# Patient Record
Sex: Male | Born: 1965
Health system: Southern US, Community
[De-identification: ages and names within clinical notes are randomized; demographics above are authoritative.]

## PROBLEM LIST (undated history)

## (undated) DIAGNOSIS — F419 Anxiety disorder, unspecified: Secondary | ICD-10-CM

## (undated) DIAGNOSIS — F4310 Post-traumatic stress disorder, unspecified: Secondary | ICD-10-CM

## (undated) DIAGNOSIS — I10 Essential (primary) hypertension: Secondary | ICD-10-CM

## (undated) DIAGNOSIS — F319 Bipolar disorder, unspecified: Secondary | ICD-10-CM

---

## 2005-04-06 ENCOUNTER — Emergency Department: Payer: Self-pay | Admitting: Emergency Medicine

## 2005-10-24 ENCOUNTER — Emergency Department: Payer: Self-pay | Admitting: Emergency Medicine

## 2005-10-24 ENCOUNTER — Other Ambulatory Visit: Payer: Self-pay

## 2005-11-29 ENCOUNTER — Emergency Department: Payer: Self-pay | Admitting: Emergency Medicine

## 2006-11-02 ENCOUNTER — Emergency Department: Payer: Self-pay | Admitting: Emergency Medicine

## 2008-06-24 ENCOUNTER — Emergency Department: Payer: Self-pay | Admitting: Emergency Medicine

## 2008-06-25 ENCOUNTER — Emergency Department: Payer: Self-pay | Admitting: Emergency Medicine

## 2009-05-22 ENCOUNTER — Emergency Department: Payer: Self-pay | Admitting: Internal Medicine

## 2009-08-26 ENCOUNTER — Emergency Department: Payer: Self-pay | Admitting: Emergency Medicine

## 2014-08-16 ENCOUNTER — Emergency Department: Payer: Self-pay | Admitting: Internal Medicine

## 2014-08-20 ENCOUNTER — Emergency Department: Payer: Self-pay | Admitting: Emergency Medicine

## 2015-01-06 ENCOUNTER — Telehealth: Payer: Self-pay | Admitting: Family Medicine

## 2015-01-06 DIAGNOSIS — I10 Essential (primary) hypertension: Secondary | ICD-10-CM

## 2015-01-06 MED ORDER — LISINOPRIL-HYDROCHLOROTHIAZIDE 10-12.5 MG PO TABS: 1.0000 | ORAL_TABLET | Freq: Every day | ORAL | Status: DC

## 2015-01-06 NOTE — Telephone Encounter (Signed)
Refill sent.

## 2015-01-06 NOTE — Telephone Encounter (Signed)
Pt is requesting refill on lisinopril. Please send to [email or web address]

## 2015-03-06 ENCOUNTER — Encounter: Payer: Self-pay | Admitting: Emergency Medicine

## 2015-03-06 ENCOUNTER — Emergency Department
Admission: EM | Admit: 2015-03-06 | Discharge: 2015-03-06 | Disposition: A | Discharge status: Home / Self Care | Payer: Self-pay | Attending: Emergency Medicine | Admitting: Emergency Medicine

## 2015-03-06 DIAGNOSIS — Z79899 Other long term (current) drug therapy: Secondary | ICD-10-CM | POA: Insufficient documentation

## 2015-03-06 DIAGNOSIS — K2970 Gastritis, unspecified, without bleeding: Secondary | ICD-10-CM | POA: Insufficient documentation

## 2015-03-06 DIAGNOSIS — I10 Essential (primary) hypertension: Secondary | ICD-10-CM | POA: Insufficient documentation

## 2015-03-06 HISTORY — DX: Essential (primary) hypertension: I10

## 2015-03-06 HISTORY — DX: Bipolar disorder, unspecified: F31.9

## 2015-03-06 HISTORY — DX: Anxiety disorder, unspecified: F41.9

## 2015-03-06 HISTORY — DX: Post-traumatic stress disorder, unspecified: F43.10

## 2015-03-06 LAB — URINALYSIS COMPLETE WITH MICROSCOPIC (ARMC ONLY)
BILIRUBIN URINE: NEGATIVE
Bacteria, UA: NONE SEEN
GLUCOSE, UA: NEGATIVE mg/dL
Hgb urine dipstick: NEGATIVE
KETONES UR: NEGATIVE mg/dL
Leukocytes, UA: NEGATIVE
Nitrite: NEGATIVE
Protein, ur: NEGATIVE mg/dL
SQUAMOUS EPITHELIAL / LPF: NONE SEEN
Specific Gravity, Urine: 1.017 (ref 1.005–1.030)
pH: 6 (ref 5.0–8.0)

## 2015-03-06 MED ORDER — ONDANSETRON 8 MG PO TBDP
ORAL_TABLET | ORAL | Status: AC
  Filled 2015-03-06: qty 1

## 2015-03-06 MED ORDER — RANITIDINE HCL 150 MG PO CAPS: 150.0000 mg | ORAL_CAPSULE | Freq: Two times a day (BID) | ORAL | Status: DC

## 2015-03-06 MED ORDER — ONDANSETRON 8 MG PO TBDP
8.0000 mg | ORAL_TABLET | Freq: Once | ORAL | Status: AC
  Administered 2015-03-06: 8 mg via ORAL

## 2015-03-06 MED ORDER — SUCRALFATE 1 G PO TABS: 1.0000 g | ORAL_TABLET | Freq: Four times a day (QID) | ORAL | Status: DC

## 2015-03-06 NOTE — ED Provider Notes (Signed)
[facility] Emergency Department Provider Note  ____________________________________________  Time seen: 5:00 AM  I have reviewed the triage vital signs and the nursing notes.   HISTORY  Chief Complaint Emesis    HPI Jose May is a 49 y.o. male who complains of upper abdominal pain and nausea and vomiting after eating at [employer]. He does report a history of acid reflux. He also has gallstones but has not had any surgeries in the past. No fevers or chills chest pain shortness of breath dizziness or syncope. No diarrhea. He was given Zofran on arrival in the ED, and is now feeling much better.When he had pain, was in the epigastric and left upper abdomen, sharp, nonradiating, no aggravating or alleviating factors.     Past Medical History  Diagnosis Date  . Hypertension   . PTSD (post-traumatic stress disorder)   . Anxiety   . Bipolar 1 disorder     There are no active problems to display for this patient.   History reviewed. No pertinent past surgical history.  Current Outpatient Rx  Name  Route  Sig  Dispense  Refill  . QUEtiapine (SEROQUEL) 100 MG tablet   Oral   Take 100 mg by mouth at bedtime.         . traZODone (DESYREL) 100 MG tablet   Oral   Take 100 mg by mouth at bedtime.         Marland Kitchen lisinopril-hydrochlorothiazide (PRINZIDE,ZESTORETIC) 10-12.5 MG per tablet   Oral   Take 1 tablet by mouth daily.   90 tablet   1   . ranitidine (ZANTAC) 150 MG capsule   Oral   Take 1 capsule (150 mg total) by mouth 2 (two) times daily.   28 capsule   0   . sucralfate (CARAFATE) 1 G tablet   Oral   Take 1 tablet (1 g total) by mouth 4 (four) times daily.   120 tablet   1     Allergies Review of patient's allergies indicates no known allergies.  History reviewed. No pertinent family history.  Social History History  Substance Use Topics  . Smoking status: Never Smoker   . Smokeless tobacco: Not on file  . Alcohol  Use: No    Review of Systems  Constitutional: No fever or chills. No weight changes Eyes:No blurry vision or double vision.  ENT: No sore throat. Cardiovascular: No chest pain. Respiratory: No dyspnea or cough. Gastrointestinal: Upper abdominal pain with vomiting.  No BRBPR or melena. Genitourinary: Negative for dysuria, urinary retention, bloody urine, or difficulty urinating. Musculoskeletal: Negative for back pain. No joint swelling or pain. Skin: Negative for rash. Neurological: Negative for headaches, focal weakness or numbness. Psychiatric:No anxiety or depression.   Endocrine:No hot/cold intolerance, changes in energy, or sleep difficulty.  10-point ROS otherwise negative.  ____________________________________________   PHYSICAL EXAM:  VITAL SIGNS: ED Triage Vitals  Enc Vitals Group     BP 03/06/15 0252 128/88 mmHg     Pulse Rate 03/06/15 0252 106     Resp 03/06/15 0252 18     Temp 03/06/15 0252 98.6 F (37 C)     Temp Source 03/06/15 0252 Oral     SpO2 03/06/15 0252 97 %     Weight 03/06/15 0252 190 lb (86.183 kg)     Height 03/06/15 0252 5\' 9"  (1.753 m)     Head Cir --      Peak Flow --      Pain Score 03/06/15  0256 6     Pain Loc --      Pain Edu? --      Excl. in GC? --      Constitutional: Alert and oriented. Well appearing and in no distress. Calm and comfortable Eyes: No scleral icterus. No conjunctival pallor. PERRL. EOMI ENT   Head: Normocephalic and atraumatic.   Nose: No congestion/rhinnorhea. No septal hematoma   Mouth/Throat: MMM, no pharyngeal erythema. No peritonsillar mass. No uvula shift.   Neck: No stridor. No SubQ emphysema. No meningismus. Hematological/Lymphatic/Immunilogical: No cervical lymphadenopathy. Cardiovascular: RRR. Normal and symmetric distal pulses are present in all extremities. No murmurs, rubs, or gallops. Respiratory: Normal respiratory effort without tachypnea nor retractions. Breath sounds are clear and  equal bilaterally. No wheezes/rales/rhonchi. Gastrointestinal: Mild left upper quadrant tenderness. No distention. There is no CVA tenderness.  No rebound, rigidity, or guarding. Genitourinary: deferred Musculoskeletal: Nontender with normal range of motion in all extremities. No joint effusions.  No lower extremity tenderness.  No edema. Neurologic:   Normal speech and language.  CN 2-10 normal. Motor grossly intact. No pronator drift.  Normal gait. No gross focal neurologic deficits are appreciated.  Skin:  Skin is warm, dry and intact. No rash noted.  No petechiae, purpura, or bullae. Psychiatric: Mood and affect are normal. Speech and behavior are normal. Patient exhibits appropriate insight and judgment.  ____________________________________________    LABS (pertinent positives/negatives) (all labs ordered are listed, but only abnormal results are displayed) Labs Reviewed  URINALYSIS COMPLETEWITH MICROSCOPIC ([facility] ONLY) - Abnormal; Notable for the following:    Color, Urine YELLOW (*)    APPearance CLEAR (*)    All other components within normal limits   ____________________________________________   EKG    ____________________________________________    RADIOLOGY    ____________________________________________   PROCEDURES  ____________________________________________   INITIAL IMPRESSION / ASSESSMENT AND PLAN / ED COURSE  Pertinent labs & imaging results that were available during my care of the patient were reviewed by me and considered in my medical decision making (see chart for details).  Patient presents with gastritis after eating fast food. He is feeling much better after receiving Zofran. Low suspicion for pancreatitis cholecystitis AAA or surgical abdomen. No evidence of cardiopulmonary pathology or sepsis. We'll give him antacids and keep him on H2 blocker for a week.  ____________________________________________   FINAL CLINICAL IMPRESSION(S)  / ED DIAGNOSES  Final diagnoses:  Gastritis      Sharman Cheek, MD 03/06/15 [phone]

## 2015-03-06 NOTE — ED Notes (Signed)
Pt presents to ER alert and in NAD. Pt states he has vomited several times tonight. Pt denies diarrhea.

## 2015-03-06 NOTE — Discharge Instructions (Signed)
Gastritis, Adult °Gastritis is soreness and puffiness (inflammation) of the lining of the stomach. If you do not get help, gastritis can cause bleeding and sores (ulcers) in the stomach. °HOME CARE  °· Only take medicine as told by your doctor. °· If you were given antibiotic medicines, take them as told. Finish the medicines even if you start to feel better. °· Drink enough fluids to keep your pee (urine) clear or pale yellow. °· Avoid foods and drinks that make your problems worse. Foods you may want to avoid include: °¨ Caffeine or alcohol. °¨ Chocolate. °¨ Mint. °¨ Garlic and onions. °¨ Spicy foods. °¨ Citrus fruits, including oranges, lemons, or limes. °¨ Food containing tomatoes, including sauce, chili, salsa, and pizza. °¨ Fried and fatty foods. °· Eat small meals throughout the day instead of large meals. °GET HELP RIGHT AWAY IF:  °· You have black or dark red poop (stools). °· You throw up (vomit) blood. It may look like coffee grounds. °· You cannot keep fluids down. °· Your belly (abdominal) pain gets worse. °· You have a fever. °· You do not feel better after 1 week. °· You have any other questions or concerns. °MAKE SURE YOU:  °· Understand these instructions. °· Will watch your condition. °· Will get help right away if you are not doing well or get worse. °Document Released: 01/10/2008 Document Revised: 10/16/2011 Document Reviewed: 09/06/2011 °Exit[employer]® Patient Information ©2015 [employer]. This information is not intended to replace advice given to you by your health care provider. Make sure you discuss any questions you have with your health care provider. ° °

## 2016-03-11 ENCOUNTER — Encounter: Payer: Self-pay | Admitting: *Deleted

## 2016-03-11 ENCOUNTER — Emergency Department
Admission: EM | Admit: 2016-03-11 | Discharge: 2016-03-11 | Disposition: A | Discharge status: Home / Self Care | Payer: Self-pay | Attending: Emergency Medicine | Admitting: Emergency Medicine

## 2016-03-11 DIAGNOSIS — Z79899 Other long term (current) drug therapy: Secondary | ICD-10-CM | POA: Insufficient documentation

## 2016-03-11 DIAGNOSIS — I10 Essential (primary) hypertension: Secondary | ICD-10-CM | POA: Insufficient documentation

## 2016-03-11 DIAGNOSIS — Z760 Encounter for issue of repeat prescription: Secondary | ICD-10-CM | POA: Insufficient documentation

## 2016-03-11 MED ORDER — QUETIAPINE FUMARATE 100 MG PO TABS: 100.0000 mg | ORAL_TABLET | Freq: Every day | ORAL | 0 refills | Status: DC

## 2016-03-11 MED ORDER — QUETIAPINE FUMARATE 25 MG PO TABS
100.0000 mg | ORAL_TABLET | Freq: Once | ORAL | Status: AC
  Administered 2016-03-11: 100 mg via ORAL
  Filled 2016-03-11: qty 4

## 2016-03-11 NOTE — ED Provider Notes (Signed)
[facility] Emergency Department Provider Note  ____________________________________________  Time seen: Approximately 8:38 PM  I have reviewed the triage vital signs and the nursing notes.   HISTORY  Chief Complaint Anxiety and Medication Refill   HPI Jose May is a 50 y.o. male who presents to the emergency department for a dose of Seroquel. His significant other states that they tried to get it refilled on Friday, but the prescription was not called in. He has not missed any doses, but he does not have any for the weekend.  Past Medical History:  Diagnosis Date  . Anxiety   . Bipolar 1 disorder (HCC)   . Hypertension   . PTSD (post-traumatic stress disorder)     There are no active problems to display for this patient.   History reviewed. No pertinent surgical history.  Prior to Admission medications   Medication Sig Start Date End Date Taking? Authorizing Provider  lisinopril-hydrochlorothiazide (PRINZIDE,ZESTORETIC) 10-12.5 MG per tablet Take 1 tablet by mouth daily. 01/06/15   Edwena Felty, MD  QUEtiapine (SEROQUEL) 100 MG tablet Take 1 tablet (100 mg total) by mouth at bedtime. 03/11/16 03/14/16  Chinita Pester, FNP  ranitidine (ZANTAC) 150 MG capsule Take 1 capsule (150 mg total) by mouth 2 (two) times daily. 03/06/15   Sharman Cheek, MD  sucralfate (CARAFATE) 1 G tablet Take 1 tablet (1 g total) by mouth 4 (four) times daily. 03/06/15   Sharman Cheek, MD  traZODone (DESYREL) 100 MG tablet Take 100 mg by mouth at bedtime.    Historical Provider, MD    Allergies Review of patient's allergies indicates no known allergies.  History reviewed. No pertinent family history.  Social History Social History  Substance Use Topics  . Smoking status: Never Smoker  . Smokeless tobacco: Never Used  . Alcohol use No    Review of Systems Constitutional: Negative for fever. ENT: Negative for sore throat. Respiratory: No cough or  congestion Gastrointestinal: No abdominal pain.  No nausea, no vomiting.  No diarrhea.  Musculoskeletal: Negative for generalized body aches. Skin: Negative for rash/lesion/wound. Neurological: Negative for headaches, focal weakness or numbness.  ____________________________________________   PHYSICAL EXAM:  VITAL SIGNS: ED Triage Vitals [03/11/16 1933]  Enc Vitals Group     BP 120/83     Pulse Rate 88     Resp 16     Temp 98.1 F (36.7 C)     Temp Source Oral     SpO2 95 %     Weight 188 lb (85.3 kg)     Height 5' 8.5" (1.74 m)     Head Circumference      Peak Flow      Pain Score      Pain Loc      Pain Edu?      Excl. in GC?     Constitutional: Alert and oriented. Well appearing and in no acute distress. Eyes: Conjunctivae are normal. PERRL. EOMI. Head: Atraumatic. Nose: No congestion/rhinnorhea. Mouth/Throat: Mucous membranes are moist. Neck: No stridor.  Cardiovascular: Normal rate, regular rhythm. Good peripheral circulation. Respiratory: Normal respiratory effort. Musculoskeletal: Full ROM throughout.  Neurologic:  Normal speech and language. No gross focal neurologic deficits are appreciated. Speech is normal. No gait instability. Skin:  Skin is warm, dry and intact. No rash noted. Psychiatric: Mood and affect are normal. Speech and behavior are normal.  ____________________________________________   LABS (all labs ordered are listed, but only abnormal results are displayed)  Labs Reviewed -  No data to display ____________________________________________  EKG   ____________________________________________  RADIOLOGY   ____________________________________________   PROCEDURES   ____________________________________________   INITIAL IMPRESSION / ASSESSMENT AND PLAN / ED COURSE  Pertinent labs & imaging results that were available during my care of the patient were reviewed by me and considered in my medical decision making (see chart for  details).  Patient was given his nightly dose of Seroquel while here. Last month's prescription bottle was presented and the dosage was confirmed. He will be given a prescription for 3 days supply.  Patient was advised to call [facility] Monday morning to follow up on the prescription for the month. ____________________________________________   FINAL CLINICAL IMPRESSION(S) / ED DIAGNOSES  Final diagnoses:  Medication refill       Chinita Pester, FNP 03/11/16 4098    Jennye Moccasin, MD 03/11/16 2147

## 2016-03-11 NOTE — ED Notes (Signed)
Pt to ED for med refill for seroquel. Pt states he tried to get it refilled Friday but MD left office. Pt denies any pain , some anxiety today. Last dose was last night . Pt in NAD at thsi time

## 2016-03-11 NOTE — ED Triage Notes (Signed)
Pt arrived to ED reporting he ran out of Quetiapine. Pt took medication last night and reports that he usually takes 100mg  at night but is unable to take tonight and feels as though he is having a panic attack. Pt reports he feels as though his heart is racing and he is SOB. Pt is calm in triage and shows no sign of distress. Pt reports he called PCP for refill but PCP did not refill before the end of the day.

## 2017-03-25 ENCOUNTER — Emergency Department
Admission: EM | Admit: 2017-03-25 | Discharge: 2017-03-25 | Disposition: A | Discharge status: Home / Self Care | Payer: Medicaid Other | Attending: Emergency Medicine | Admitting: Emergency Medicine

## 2017-03-25 ENCOUNTER — Encounter: Payer: Self-pay | Admitting: Emergency Medicine

## 2017-03-25 ENCOUNTER — Emergency Department: Payer: Medicaid Other

## 2017-03-25 DIAGNOSIS — F319 Bipolar disorder, unspecified: Secondary | ICD-10-CM | POA: Diagnosis not present

## 2017-03-25 DIAGNOSIS — F419 Anxiety disorder, unspecified: Secondary | ICD-10-CM | POA: Diagnosis not present

## 2017-03-25 DIAGNOSIS — R1031 Right lower quadrant pain: Secondary | ICD-10-CM | POA: Diagnosis not present

## 2017-03-25 DIAGNOSIS — I10 Essential (primary) hypertension: Secondary | ICD-10-CM | POA: Insufficient documentation

## 2017-03-25 DIAGNOSIS — R1011 Right upper quadrant pain: Secondary | ICD-10-CM | POA: Diagnosis present

## 2017-03-25 DIAGNOSIS — N200 Calculus of kidney: Secondary | ICD-10-CM | POA: Diagnosis not present

## 2017-03-25 LAB — COMPREHENSIVE METABOLIC PANEL
ALT: 16 U/L — ABNORMAL LOW (ref 17–63)
AST: 21 U/L (ref 15–41)
Albumin: 4.2 g/dL (ref 3.5–5.0)
Alkaline Phosphatase: 79 U/L (ref 38–126)
Anion gap: 9 (ref 5–15)
BUN: 17 mg/dL (ref 6–20)
CHLORIDE: 102 mmol/L (ref 101–111)
CO2: 27 mmol/L (ref 22–32)
CREATININE: 1.52 mg/dL — AB (ref 0.61–1.24)
Calcium: 9 mg/dL (ref 8.9–10.3)
GFR calc Af Amer: 59 mL/min — ABNORMAL LOW (ref >60)
GFR, EST NON AFRICAN AMERICAN: 51 mL/min — AB (ref >60)
GLUCOSE: 154 mg/dL — AB (ref 65–99)
Potassium: 3.9 mmol/L (ref 3.5–5.1)
Sodium: 138 mmol/L (ref 135–145)
Total Bilirubin: 0.8 mg/dL (ref 0.3–1.2)
Total Protein: 8.1 g/dL (ref 6.5–8.1)

## 2017-03-25 LAB — URINALYSIS, COMPLETE (UACMP) WITH MICROSCOPIC
Bacteria, UA: NONE SEEN
Bilirubin Urine: NEGATIVE
Glucose, UA: NEGATIVE mg/dL
Ketones, ur: 20 mg/dL — AB
Leukocytes, UA: NEGATIVE
Nitrite: NEGATIVE
PH: 5 (ref 5.0–8.0)
Protein, ur: 30 mg/dL — AB
SPECIFIC GRAVITY, URINE: 1.029 (ref 1.005–1.030)
SQUAMOUS EPITHELIAL / LPF: NONE SEEN

## 2017-03-25 LAB — CBC
HCT: 42.5 % (ref 40.0–52.0)
Hemoglobin: 14.1 g/dL (ref 13.0–18.0)
MCH: 26.7 pg (ref 26.0–34.0)
MCHC: 33.1 g/dL (ref 32.0–36.0)
MCV: 80.7 fL (ref 80.0–100.0)
PLATELETS: 323 10*3/uL (ref 150–440)
RBC: 5.27 MIL/uL (ref 4.40–5.90)
RDW: 14.9 % — AB (ref 11.5–14.5)
WBC: 13.3 10*3/uL — AB (ref 3.8–10.6)

## 2017-03-25 LAB — LIPASE, BLOOD: LIPASE: 20 U/L (ref 11–51)

## 2017-03-25 MED ORDER — ONDANSETRON HCL 4 MG PO TABS: 4.0000 mg | ORAL_TABLET | Freq: Three times a day (TID) | ORAL | 0 refills | Status: DC | PRN

## 2017-03-25 MED ORDER — OXYCODONE-ACETAMINOPHEN 5-325 MG PO TABS: 1.0000 | ORAL_TABLET | Freq: Four times a day (QID) | ORAL | 0 refills | Status: DC | PRN

## 2017-03-25 MED ORDER — TAMSULOSIN HCL 0.4 MG PO CAPS: 0.4000 mg | ORAL_CAPSULE | Freq: Every day | ORAL | 0 refills | Status: AC

## 2017-03-25 MED ORDER — SODIUM CHLORIDE 0.9 % IV BOLUS (SEPSIS)
1000.0000 mL | Freq: Once | INTRAVENOUS | Status: AC
  Administered 2017-03-25: 1000 mL via INTRAVENOUS

## 2017-03-25 MED ORDER — IBUPROFEN 600 MG PO TABS: 600.0000 mg | ORAL_TABLET | Freq: Four times a day (QID) | ORAL | 0 refills | Status: DC | PRN

## 2017-03-25 MED ORDER — ONDANSETRON HCL 4 MG/2ML IJ SOLN
4.0000 mg | Freq: Once | INTRAMUSCULAR | Status: AC
  Administered 2017-03-25: 4 mg via INTRAVENOUS
  Filled 2017-03-25: qty 2

## 2017-03-25 MED ORDER — MORPHINE SULFATE (PF) 4 MG/ML IV SOLN
4.0000 mg | Freq: Once | INTRAVENOUS | Status: AC
  Administered 2017-03-25: 4 mg via INTRAVENOUS
  Filled 2017-03-25: qty 1

## 2017-03-25 MED ORDER — TAMSULOSIN HCL 0.4 MG PO CAPS
0.4000 mg | ORAL_CAPSULE | Freq: Once | ORAL | Status: AC
  Administered 2017-03-25: 0.4 mg via ORAL
  Filled 2017-03-25: qty 1

## 2017-03-25 MED ORDER — IBUPROFEN 600 MG PO TABS
600.0000 mg | ORAL_TABLET | Freq: Once | ORAL | Status: AC
  Administered 2017-03-25: 600 mg via ORAL
  Filled 2017-03-25: qty 1

## 2017-03-25 NOTE — ED Notes (Signed)
Immediately after starting fluids IV was noted to be infiltrated; IV removed and new IV started to left AC: pt tolerated well

## 2017-03-25 NOTE — ED Provider Notes (Signed)
[facility] Emergency Department Provider Note  ____________________________________________  Time seen: Approximately 5:23 AM  I have reviewed the triage vital signs and the nursing notes.   HISTORY  Chief Complaint Flank Pain and Abdominal Pain   HPI Jose May is a 51 y.o. male with a history of bipolar, hypertension, anxiety who presents for evaluation of right flank pain. Patient reports sudden onset of severe right flank pain radiating to his right lower abdomen, constant, sharp, 10 out of 10, associated with nausea and several episodes of nonbloody nonbilious emesis. Patient thinks he might have had a kidney stone in the past but he is not sure. No dysuria or hematuria. No fever or chills. No prior abdominal surgeries. No diarrhea or constipation.  Past Medical History:  Diagnosis Date  . Anxiety   . Bipolar 1 disorder (HCC)   . Hypertension   . PTSD (post-traumatic stress disorder)     There are no active problems to display for this patient.   History reviewed. No pertinent surgical history.  Prior to Admission medications   Medication Sig Start Date End Date Taking? Authorizing Provider  QUEtiapine (SEROQUEL) 100 MG tablet Take 1 tablet (100 mg total) by mouth at bedtime. 03/11/16 03/25/17 Yes Triplett, Cari B, FNP  traZODone (DESYREL) 100 MG tablet Take 100 mg by mouth at bedtime.   Yes [provider]  ibuprofen (ADVIL,MOTRIN) 600 MG tablet Take 1 tablet (600 mg total) by mouth every 6 (six) hours as needed. 03/25/17   Nita Sickle, MD  ondansetron (ZOFRAN) 4 MG tablet Take 1 tablet (4 mg total) by mouth every 8 (eight) hours as needed for nausea or vomiting. 03/25/17   Don Perking, [place], MD  oxyCODONE-acetaminophen (ROXICET) 5-325 MG tablet Take 1 tablet by mouth every 6 (six) hours as needed. 03/25/17 03/25/18  Nita Sickle, MD  tamsulosin (FLOMAX) 0.4 MG CAPS capsule Take 1 capsule (0.4 mg total) by mouth  daily. 03/25/17 04/01/17  Nita Sickle, MD    Allergies Patient has no known allergies.  History reviewed. No pertinent family history.  Social History Social History  Substance Use Topics  . Smoking status: Never Smoker  . Smokeless tobacco: Never Used  . Alcohol use No    Review of Systems  Constitutional: Negative for fever. Eyes: Negative for visual changes. ENT: Negative for sore throat. Neck: No neck pain  Cardiovascular: Negative for chest pain. Respiratory: Negative for shortness of breath. Gastrointestinal: Negative for abdominal pain, vomiting or diarrhea. Genitourinary: Negative for dysuria. + R flank pain Musculoskeletal: Negative for back pain. Skin: Negative for rash. Neurological: Negative for headaches, weakness or numbness. Psych: No SI or HI  ____________________________________________   PHYSICAL EXAM:  VITAL SIGNS: ED Triage Vitals  Enc Vitals Group     BP 03/25/17 0116 (!) 143/96     Pulse Rate 03/25/17 0116 82     Resp 03/25/17 0116 (!) 22     Temp 03/25/17 0116 98.9 F (37.2 C)     Temp Source 03/25/17 0116 Oral     SpO2 03/25/17 0116 96 %     Weight 03/25/17 0114 188 lb (85.3 kg)     Height 03/25/17 0114 5\' 9"  (1.753 m)     Head Circumference --      Peak Flow --      Pain Score 03/25/17 0432 10     Pain Loc --      Pain Edu? --      Excl. in GC? --  Constitutional: Alert and oriented. Well appearing and in no apparent distress. HEENT:      Head: Normocephalic and atraumatic.         Eyes: Conjunctivae are normal. Sclera is non-icteric.       Mouth/Throat: Mucous membranes are moist.       Neck: Supple with no signs of meningismus. Cardiovascular: Regular rate and rhythm. No murmurs, gallops, or rubs. 2+ symmetrical distal pulses are present in all extremities. No JVD. Respiratory: Normal respiratory effort. Lungs are clear to auscultation bilaterally. No wheezes, crackles, or rhonchi.  Gastrointestinal: Soft, non tender,  and non distended with positive bowel sounds. No rebound or guarding. Genitourinary: R CVA tenderness. Musculoskeletal: Nontender with normal range of motion in all extremities. No edema, cyanosis, or erythema of extremities. Neurologic: Normal speech and language. Face is symmetric. Moving all extremities. No gross focal neurologic deficits are appreciated. Skin: Skin is warm, dry and intact. No rash noted. Psychiatric: Mood and affect are normal. Speech and behavior are normal.  ____________________________________________   LABS (all labs ordered are listed, but only abnormal results are displayed)  Labs Reviewed  COMPREHENSIVE METABOLIC PANEL - Abnormal; Notable for the following:       Result Value   Glucose, Bld 154 (*)    Creatinine, Ser 1.52 (*)    ALT 16 (*)    GFR calc non Af Amer 51 (*)    GFR calc Af Amer 59 (*)    All other components within normal limits  CBC - Abnormal; Notable for the following:    WBC 13.3 (*)    RDW 14.9 (*)    All other components within normal limits  URINALYSIS, COMPLETE (UACMP) WITH MICROSCOPIC - Abnormal; Notable for the following:    Color, Urine YELLOW (*)    APPearance CLEAR (*)    Hgb urine dipstick MODERATE (*)    Ketones, ur 20 (*)    Protein, ur 30 (*)    All other components within normal limits  LIPASE, BLOOD   ____________________________________________  EKG  none  ____________________________________________  RADIOLOGY  CT renal:   ____________________________________________   PROCEDURES  Procedure(s) performed: None Procedures Critical Care performed:  None ____________________________________________   INITIAL IMPRESSION / ASSESSMENT AND PLAN / ED COURSE   51 y.o. male with a history of bipolar, hypertension, anxiety who presents for evaluation of sudden onset of right flank pain radiating to the right lower quadrant and associated with nausea and vomiting. UA showing blood but no evidence of infection.  Creatinine of 1.52 with normal GFR for an African American. CT showing a 4 mm distal right ureteral stone. Patient will be given ibuprofen, morphine, Flomax, Zofran and reassess.  ______________ 7:12 AM on 03/25/2017 -----------------------------------------  Pain is well controlled. Patient is tolerating by mouth. Patients can be discharged home on ibuprofen, Flomax, Zofran, Percocet, a referral to urology. Recommended return to the emergency room for pain not well controlled at home, fever, nausea or vomiting, worsening abdominal pain, or dysuria.  Pertinent labs & imaging results that were available during my care of the patient were reviewed by me and considered in my medical decision making (see chart for details).    ____________________________________________   FINAL CLINICAL IMPRESSION(S) / ED DIAGNOSES  Final diagnoses:  Kidney stone      NEW MEDICATIONS STARTED DURING THIS VISIT:  New Prescriptions   IBUPROFEN (ADVIL,MOTRIN) 600 MG TABLET    Take 1 tablet (600 mg total) by mouth every 6 (six) hours as needed.  ONDANSETRON (ZOFRAN) 4 MG TABLET    Take 1 tablet (4 mg total) by mouth every 8 (eight) hours as needed for nausea or vomiting.   OXYCODONE-ACETAMINOPHEN (ROXICET) 5-325 MG TABLET    Take 1 tablet by mouth every 6 (six) hours as needed.   TAMSULOSIN (FLOMAX) 0.4 MG CAPS CAPSULE    Take 1 capsule (0.4 mg total) by mouth daily.     Note:  This document was prepared using Dragon voice recognition software and may include unintentional dictation errors.    Nita Sickle, MD 03/25/17 [phone]

## 2017-03-25 NOTE — ED Triage Notes (Signed)
Patient reports pain to right flank that radiates into right lower quad.  Denies urinary symptoms.

## 2017-03-25 NOTE — ED Notes (Signed)
Patient resting quietly laying on chairs in lobby.

## 2017-03-25 NOTE — ED Notes (Signed)
Pt given urinal by ED tech Lorin Picket, pt will use callbell when finished voiding so urine can be strained

## 2017-03-25 NOTE — Discharge Instructions (Signed)

## 2017-07-02 ENCOUNTER — Encounter (INDEPENDENT_AMBULATORY_CARE_PROVIDER_SITE_OTHER): Payer: Self-pay

## 2017-07-02 ENCOUNTER — Ambulatory Visit: Payer: Self-pay | Admitting: Pharmacy Technician

## 2017-07-02 DIAGNOSIS — Z79899 Other long term (current) drug therapy: Secondary | ICD-10-CM

## 2017-07-02 NOTE — Progress Notes (Signed)
  Completed [facility] application and contract.  Patient agreed to all terms of the [facility] contract.    Patient approved to receive medication assistance at [facility] through 2018, as long as eligibility continues to be met.    Provided patient with Civil engineer, contracting based on his particular needs.    Referred patient to [facility].  [place]

## 2017-07-17 ENCOUNTER — Ambulatory Visit: Payer: Self-pay

## 2017-08-14 ENCOUNTER — Ambulatory Visit: Payer: Self-pay | Admitting: Family Medicine

## 2017-08-14 VITALS — BP 142/92 | HR 85 | Temp 98.6°F | Wt 197.4 lb

## 2017-08-14 DIAGNOSIS — K219 Gastro-esophageal reflux disease without esophagitis: Secondary | ICD-10-CM

## 2017-08-14 DIAGNOSIS — N529 Male erectile dysfunction, unspecified: Secondary | ICD-10-CM

## 2017-08-14 DIAGNOSIS — N289 Disorder of kidney and ureter, unspecified: Secondary | ICD-10-CM

## 2017-08-14 DIAGNOSIS — I10 Essential (primary) hypertension: Secondary | ICD-10-CM

## 2017-08-14 MED ORDER — RANITIDINE HCL 150 MG PO TABS: 150.0000 mg | ORAL_TABLET | Freq: Two times a day (BID) | ORAL | 3 refills | Status: DC

## 2017-08-14 MED ORDER — LOSARTAN POTASSIUM 25 MG PO TABS: 25.0000 mg | ORAL_TABLET | Freq: Every day | ORAL | 3 refills | Status: DC

## 2017-08-14 NOTE — Progress Notes (Signed)
BP (!) 142/92 (BP Location: Left Arm, Patient Position: Sitting, Cuff Size: Normal)   Pulse 85   Temp 98.6 F (37 C)   Wt 197 lb 6.4 oz (89.5 kg)   BMI 29.15 kg/m    Subjective:    Patient ID: Jose May, male    DOB: 1965-10-18, 52 y.o.   MRN: [id number]  HPI: Gurshan Settlemire is a 52 y.o. male  Chief Complaint  Patient presents with  . Establish Care    HPI He has been without care for a while He was at [facility] but lost his insurance High blood pressure; checks BP at [facility] sometimes Was trying to donate plasma but his pressure would run high He used to take lisinopril; sometimes upset his stomach He has psychiatrist at [facility] He has acid reflux, used to take a pill; worse at night; knows triggers Has ED; reluctant to discuss; wife says this has been going on for a while Had a kidney stone; was in the hospital, labs reviewed  No flowsheet data found.  Relevant past medical, surgical, family and social history reviewed Past Medical History:  Diagnosis Date  . Anxiety   . Bipolar 1 disorder (HCC)   . Hypertension   . PTSD (post-traumatic stress disorder)    History reviewed. No pertinent surgical history. Family History  Problem Relation Age of Onset  . Diabetes Mother   . Hypertension Mother   . Hypertension Father    Social History   Tobacco Use  . Smoking status: Never Smoker  . Smokeless tobacco: Never Used  Substance Use Topics  . Alcohol use: No  . Drug use: No    Interim medical history since last visit reviewed. Allergies and medications reviewed  Review of Systems Per HPI unless specifically indicated above     Objective:    BP (!) 142/92 (BP Location: Left Arm, Patient Position: Sitting, Cuff Size: Normal)   Pulse 85   Temp 98.6 F (37 C)   Wt 197 lb 6.4 oz (89.5 kg)   BMI 29.15 kg/m   Wt Readings from Last 3 Encounters:  08/14/17 197 lb 6.4 oz (89.5 kg)  03/25/17 188 lb (85.3 kg)  03/11/16 188 lb (85.3  kg)    Physical Exam  Constitutional: He appears well-developed and well-nourished. No distress.  Eyes: No scleral icterus.  Cardiovascular: Normal rate and regular rhythm.  Pulmonary/Chest: Effort normal and breath sounds normal.  Musculoskeletal: He exhibits no edema.  Neurological: He is alert.  Skin: No pallor.  Psychiatric: He has a normal mood and affect.    Results for orders placed or performed during the hospital encounter of 03/25/17  Lipase, blood  Result Value Ref Range   Lipase 20 11 - 51 U/L  Comprehensive metabolic panel  Result Value Ref Range   Sodium 138 135 - 145 mmol/L   Potassium 3.9 3.5 - 5.1 mmol/L   Chloride 102 101 - 111 mmol/L   CO2 27 22 - 32 mmol/L   Glucose, Bld 154 (H) 65 - 99 mg/dL   BUN 17 6 - 20 mg/dL   Creatinine, Ser 4.09 (H) 0.61 - 1.24 mg/dL   Calcium 9.0 8.9 - 81.1 mg/dL   Total Protein 8.1 6.5 - 8.1 g/dL   Albumin 4.2 3.5 - 5.0 g/dL   AST 21 15 - 41 U/L   ALT 16 (L) 17 - 63 U/L   Alkaline Phosphatase 79 38 - 126 U/L   Total Bilirubin 0.8 0.3 - 1.2  mg/dL   GFR calc non Af Amer 51 (L) >60 mL/min   GFR calc Af Amer 59 (L) >60 mL/min   Anion gap 9 5 - 15  CBC  Result Value Ref Range   WBC 13.3 (H) 3.8 - 10.6 K/uL   RBC 5.27 4.40 - 5.90 MIL/uL   Hemoglobin 14.1 13.0 - 18.0 g/dL   HCT 62.942.5 52.840.0 - 41.352.0 %   MCV 80.7 80.0 - 100.0 fL   MCH 26.7 26.0 - 34.0 pg   MCHC 33.1 32.0 - 36.0 g/dL   RDW 24.414.9 (H) 01.011.5 - 27.214.5 %   Platelets 323 150 - 440 K/uL  Urinalysis, Complete w Microscopic  Result Value Ref Range   Color, Urine YELLOW (A) YELLOW   APPearance CLEAR (A) CLEAR   Specific Gravity, Urine 1.029 1.005 - 1.030   pH 5.0 5.0 - 8.0   Glucose, UA NEGATIVE NEGATIVE mg/dL   Hgb urine dipstick MODERATE (A) NEGATIVE   Bilirubin Urine NEGATIVE NEGATIVE   Ketones, ur 20 (A) NEGATIVE mg/dL   Protein, ur 30 (A) NEGATIVE mg/dL   Nitrite NEGATIVE NEGATIVE   Leukocytes, UA NEGATIVE NEGATIVE   RBC / HPF 6-30 0 - 5 RBC/hpf   WBC, UA 0-5 0 - 5  WBC/hpf   Bacteria, UA NONE SEEN NONE SEEN   Squamous Epithelial / LPF NONE SEEN NONE SEEN   Mucus PRESENT       Assessment & Plan:   Problem List Items Addressed This Visit    None    Visit Diagnoses    Essential hypertension, benign    -  Primary   Relevant Medications   losartan (COZAAR) 25 MG tablet   Other Relevant Orders   Basic Metabolic Panel (BMET)   Renal insufficiency       Relevant Orders   Basic Metabolic Panel (BMET)   Gastroesophageal reflux disease, esophagitis presence not specified       Relevant Medications   ranitidine (ZANTAC) 150 MG tablet   Other Relevant Orders   Basic Metabolic Panel (BMET)   Erectile dysfunction, unspecified erectile dysfunction type       Relevant Orders   Ambulatory referral to Urology   Basic Metabolic Panel (BMET)       Follow up plan: Return in about 2 weeks (around 08/28/2017).  An after-visit summary was printed and given to the patient at check-out.  Please see the patient instructions which may contain other information and recommendations beyond what is mentioned above in the assessment and plan.  Meds ordered this encounter  Medications  . ranitidine (ZANTAC) 150 MG tablet    Sig: Take 1 tablet (150 mg total) by mouth 2 (two) times daily.    Dispense:  60 tablet    Refill:  3  . losartan (COZAAR) 25 MG tablet    Sig: Take 1 tablet (25 mg total) by mouth daily.    Dispense:  30 tablet    Refill:  3    Orders Placed This Encounter  Procedures  . Basic Metabolic Panel (BMET)  . Ambulatory referral to Urology

## 2017-08-14 NOTE — Patient Instructions (Addendum)
Start losartan Return in 2 weeks for recheck and BMP here Try to follow the DASH guidelines (DASH stands for Dietary Approaches to Stop Hypertension). Try to limit the sodium in your diet to no more than 1,500mg  of sodium per day. Certainly try to not exceed 2,000 mg per day at the very most. Do not add salt when cooking or at the table.  Check the sodium amount on labels when shopping, and choose items lower in sodium when given a choice. Avoid or limit foods that already contain a lot of sodium. Eat a diet rich in fruits and vegetables and whole grains, and try to lose weight if overweight or obese   DASH Eating Plan DASH stands for "Dietary Approaches to Stop Hypertension." The DASH eating plan is a healthy eating plan that has been shown to reduce high blood pressure (hypertension). It may also reduce your risk for type 2 diabetes, heart disease, and stroke. The DASH eating plan may also help with weight loss. What are tips for following this plan? General guidelines  Avoid eating more than 2,300 mg (milligrams) of salt (sodium) a day. If you have hypertension, you may need to reduce your sodium intake to 1,500 mg a day.  Limit alcohol intake to no more than 1 drink a day for nonpregnant women and 2 drinks a day for men. One drink equals 12 oz of beer, 5 oz of wine, or 1 oz of hard liquor.  Work with your health care provider to maintain a healthy body weight or to lose weight. Ask what an ideal weight is for you.  Get at least 30 minutes of exercise that causes your heart to beat faster (aerobic exercise) most days of the week. Activities may include walking, swimming, or biking.  Work with your health care provider or diet and nutrition specialist (dietitian) to adjust your eating plan to your individual calorie needs. Reading food labels  Check food labels for the amount of sodium per serving. Choose foods with less than 5 percent of the Daily Value of sodium. Generally, foods with less  than 300 mg of sodium per serving fit into this eating plan.  To find whole grains, look for the word "whole" as the first word in the ingredient list. Shopping  Buy products labeled as "low-sodium" or "no salt added."  Buy fresh foods. Avoid canned foods and premade or frozen meals. Cooking  Avoid adding salt when cooking. Use salt-free seasonings or herbs instead of table salt or sea salt. Check with your health care provider or pharmacist before using salt substitutes.  Do not fry foods. Cook foods using healthy methods such as baking, boiling, grilling, and broiling instead.  Cook with heart-healthy oils, such as olive, canola, soybean, or sunflower oil. Meal planning   Eat a balanced diet that includes: ? 5 or more servings of fruits and vegetables each day. At each meal, try to fill half of your plate with fruits and vegetables. ? Up to 6-8 servings of whole grains each day. ? Less than 6 oz of lean meat, poultry, or fish each day. A 3-oz serving of meat is about the same size as a deck of cards. One egg equals 1 oz. ? 2 servings of low-fat dairy each day. ? A serving of nuts, seeds, or beans 5 times each week. ? Heart-healthy fats. Healthy fats called Omega-3 fatty acids are found in foods such as flaxseeds and coldwater fish, like sardines, salmon, and mackerel.  Limit how much you  eat of the following: ? Canned or prepackaged foods. ? Food that is high in trans fat, such as fried foods. ? Food that is high in saturated fat, such as fatty meat. ? Sweets, desserts, sugary drinks, and other foods with added sugar. ? Full-fat dairy products.  Do not salt foods before eating.  Try to eat at least 2 vegetarian meals each week.  Eat more home-cooked food and less restaurant, buffet, and fast food.  When eating at a restaurant, ask that your food be prepared with less salt or no salt, if possible. What foods are recommended? The items listed may not be a complete list. Talk  with your dietitian about what dietary choices are best for you. Grains Whole-grain or whole-wheat bread. Whole-grain or whole-wheat pasta. Brown rice. Orpah Cobb. Bulgur. Whole-grain and low-sodium cereals. Pita bread. Low-fat, low-sodium crackers. Whole-wheat flour tortillas. Vegetables Fresh or frozen vegetables (raw, steamed, roasted, or grilled). Low-sodium or reduced-sodium tomato and vegetable juice. Low-sodium or reduced-sodium tomato sauce and tomato paste. Low-sodium or reduced-sodium canned vegetables. Fruits All fresh, dried, or frozen fruit. Canned fruit in natural juice (without added sugar). Meat and other protein foods Skinless chicken or [place]. Ground chicken or [place]. Pork with fat trimmed off. Fish and seafood. Egg whites. Dried beans, peas, or lentils. Unsalted nuts, nut butters, and seeds. Unsalted canned beans. Lean cuts of beef with fat trimmed off. Low-sodium, lean deli meat. Dairy Low-fat (1%) or fat-free (skim) milk. Fat-free, low-fat, or reduced-fat cheeses. Nonfat, low-sodium ricotta or cottage cheese. Low-fat or nonfat yogurt. Low-fat, low-sodium cheese. Fats and oils Soft margarine without trans fats. Vegetable oil. Low-fat, reduced-fat, or light mayonnaise and salad dressings (reduced-sodium). Canola, safflower, olive, soybean, and sunflower oils. Avocado. Seasoning and other foods Herbs. Spices. Seasoning mixes without salt. Unsalted popcorn and pretzels. Fat-free sweets. What foods are not recommended? The items listed may not be a complete list. Talk with your dietitian about what dietary choices are best for you. Grains Baked goods made with fat, such as croissants, muffins, or some breads. Dry pasta or rice meal packs. Vegetables Creamed or fried vegetables. Vegetables in a cheese sauce. Regular canned vegetables (not low-sodium or reduced-sodium). Regular canned tomato sauce and paste (not low-sodium or reduced-sodium). Regular tomato and vegetable  juice (not low-sodium or reduced-sodium). Rosita Fire. Olives. Fruits Canned fruit in a light or heavy syrup. Fried fruit. Fruit in cream or butter sauce. Meat and other protein foods Fatty cuts of meat. Ribs. Fried meat. Tomasa Blase. Sausage. Bologna and other processed lunch meats. Salami. Fatback. Hotdogs. Bratwurst. Salted nuts and seeds. Canned beans with added salt. Canned or smoked fish. Whole eggs or egg yolks. Chicken or [place] with skin. Dairy Whole or 2% milk, cream, and half-and-half. Whole or full-fat cream cheese. Whole-fat or sweetened yogurt. Full-fat cheese. Nondairy creamers. Whipped toppings. Processed cheese and cheese spreads. Fats and oils Butter. Stick margarine. Lard. Shortening. Ghee. Bacon fat. Tropical oils, such as coconut, palm kernel, or palm oil. Seasoning and other foods Salted popcorn and pretzels. Onion salt, garlic salt, seasoned salt, table salt, and sea salt. Worcestershire sauce. Tartar sauce. Barbecue sauce. Teriyaki sauce. Soy sauce, including reduced-sodium. Steak sauce. Canned and packaged gravies. Fish sauce. Oyster sauce. Cocktail sauce. Horseradish that you find on the shelf. Ketchup. Mustard. Meat flavorings and tenderizers. Bouillon cubes. Hot sauce and Tabasco sauce. Premade or packaged marinades. Premade or packaged taco seasonings. Relishes. Regular salad dressings. Where to find more information:  National Heart, Lung, and Blood Institute: PopSteam.is  American  Heart Association: www.heart.org Summary  The DASH eating plan is a healthy eating plan that has been shown to reduce high blood pressure (hypertension). It may also reduce your risk for type 2 diabetes, heart disease, and stroke.  With the DASH eating plan, you should limit salt (sodium) intake to 2,300 mg a day. If you have hypertension, you may need to reduce your sodium intake to 1,500 mg a day.  When on the DASH eating plan, aim to eat more fresh fruits and vegetables, whole grains,  lean proteins, low-fat dairy, and heart-healthy fats.  Work with your health care provider or diet and nutrition specialist (dietitian) to adjust your eating plan to your individual calorie needs. This information is not intended to replace advice given to you by your health care provider. Make sure you discuss any questions you have with your health care provider. Document Released: 07/13/2011 Document Revised: 07/17/2016 Document Reviewed: 07/17/2016 Elsevier Interactive Patient Education  [employer].

## 2017-08-15 LAB — BASIC METABOLIC PANEL
BUN / CREAT RATIO: 7 — AB (ref 9–20)
BUN: 8 mg/dL (ref 6–24)
CHLORIDE: 103 mmol/L (ref 96–106)
CO2: 23 mmol/L (ref 20–29)
Calcium: 8.9 mg/dL (ref 8.7–10.2)
Creatinine, Ser: 1.19 mg/dL (ref 0.76–1.27)
GFR calc Af Amer: 81 mL/min/{1.73_m2} (ref >59)
GFR calc non Af Amer: 70 mL/min/{1.73_m2} (ref >59)
GLUCOSE: 86 mg/dL (ref 65–99)
POTASSIUM: 4.1 mmol/L (ref 3.5–5.2)
SODIUM: 141 mmol/L (ref 134–144)

## 2017-08-28 ENCOUNTER — Ambulatory Visit: Payer: Self-pay

## 2017-08-30 ENCOUNTER — Ambulatory Visit: Payer: Self-pay | Admitting: Family Medicine

## 2017-08-30 DIAGNOSIS — I10 Essential (primary) hypertension: Secondary | ICD-10-CM | POA: Insufficient documentation

## 2017-08-30 MED ORDER — LOSARTAN POTASSIUM 50 MG PO TABS: 50.0000 mg | ORAL_TABLET | Freq: Every day | ORAL | 5 refills | Status: DC

## 2017-08-30 NOTE — Progress Notes (Signed)
   Subjective:    Patient ID: Jose May, male    DOB: Feb 28, 1966, 52 y.o.   MRN: [id number]  HPI Feels well. F/u for recent lab work.   Review of Systems  GERD if he eats too much.    Objective:   Physical Exam  Wt 196 lbs   BP 131/94  T 98.4 HR 82 COR--RRR Lungs -clear Thyroid--normal     Assessment & Plan:  HTN Increase Losartan to 36m daily  RTC 2 months. Repeat CBC,Met C.TSH,Lipids Mild Renal Insuff Resolved ED Pt declines Sildenafil Rx.  I have done the exam and reviewed the chart and it is accurate to the best of my knowledge. DDevelopment worker, communityhas been used and  any errors in dictation or transcription are unintentional. RMiguel AschoffM.D. [facility]

## 2017-10-10 ENCOUNTER — Telehealth: Payer: Self-pay | Admitting: Pharmacy Technician

## 2017-10-10 NOTE — Telephone Encounter (Signed)
Received updated proof of income.  Patient eligible to receive medication assistance at [facility] through 2019, as long as eligibility requirements continue to be met.  [facility]

## 2017-10-25 ENCOUNTER — Other Ambulatory Visit: Payer: Medicaid Other

## 2017-10-25 DIAGNOSIS — Z0000 Encounter for general adult medical examination without abnormal findings: Secondary | ICD-10-CM

## 2017-10-26 LAB — COMPREHENSIVE METABOLIC PANEL
ALT: 12 IU/L (ref 0–44)
AST: 14 IU/L (ref 0–40)
Albumin/Globulin Ratio: 1.3 (ref 1.2–2.2)
Albumin: 4.1 g/dL (ref 3.5–5.5)
Alkaline Phosphatase: 90 IU/L (ref 39–117)
BUN/Creatinine Ratio: 11 (ref 9–20)
BUN: 12 mg/dL (ref 6–24)
Bilirubin Total: 0.5 mg/dL (ref 0.0–1.2)
CALCIUM: 8.9 mg/dL (ref 8.7–10.2)
CO2: 25 mmol/L (ref 20–29)
CREATININE: 1.14 mg/dL (ref 0.76–1.27)
Chloride: 106 mmol/L (ref 96–106)
GFR calc Af Amer: 85 mL/min/{1.73_m2} (ref >59)
GFR, EST NON AFRICAN AMERICAN: 74 mL/min/{1.73_m2} (ref >59)
GLOBULIN, TOTAL: 3.2 g/dL (ref 1.5–4.5)
Glucose: 109 mg/dL — ABNORMAL HIGH (ref 65–99)
Potassium: 4.5 mmol/L (ref 3.5–5.2)
Sodium: 143 mmol/L (ref 134–144)
TOTAL PROTEIN: 7.3 g/dL (ref 6.0–8.5)

## 2017-10-26 LAB — TSH: TSH: 2.49 u[IU]/mL (ref 0.450–4.500)

## 2017-10-26 LAB — LIPID PANEL
CHOL/HDL RATIO: 2.4 ratio (ref 0.0–5.0)
Cholesterol, Total: 117 mg/dL (ref 100–199)
HDL: 49 mg/dL (ref >39)
LDL Calculated: 44 mg/dL (ref 0–99)
TRIGLYCERIDES: 121 mg/dL (ref 0–149)
VLDL Cholesterol Cal: 24 mg/dL (ref 5–40)

## 2017-10-26 LAB — CBC
HEMOGLOBIN: 13.9 g/dL (ref 13.0–17.7)
Hematocrit: 41.6 % (ref 37.5–51.0)
MCH: 26.7 pg (ref 26.6–33.0)
MCHC: 33.4 g/dL (ref 31.5–35.7)
MCV: 80 fL (ref 79–97)
Platelets: 326 10*3/uL (ref 150–379)
RBC: 5.2 x10E6/uL (ref 4.14–5.80)
RDW: 15.8 % — ABNORMAL HIGH (ref 12.3–15.4)
WBC: 7.4 10*3/uL (ref 3.4–10.8)

## 2017-11-01 ENCOUNTER — Encounter: Payer: Self-pay | Admitting: Family Medicine

## 2017-11-01 ENCOUNTER — Ambulatory Visit: Payer: Medicaid Other | Admitting: Family Medicine

## 2017-11-01 VITALS — BP 127/87 | HR 67 | Temp 98.0°F | Wt 184.7 lb

## 2017-11-01 DIAGNOSIS — R5383 Other fatigue: Secondary | ICD-10-CM

## 2017-11-01 DIAGNOSIS — K219 Gastro-esophageal reflux disease without esophagitis: Secondary | ICD-10-CM | POA: Insufficient documentation

## 2017-11-01 DIAGNOSIS — I10 Essential (primary) hypertension: Secondary | ICD-10-CM

## 2017-11-01 NOTE — Progress Notes (Signed)
   Subjective:    Patient ID: Jose AcresChristopher Lamont Sweaney, male    DOB: 1965/08/16, 52 y.o.   MRN: [id number]  HPI "I am tired" Works 3rd shift. No specific c/o. Followed by psych for MDD/PTSD.   Review of Systems Fatigue only. No snoring or apnea    Objective:   Physical Exam  BP127/87  Wt 184 WNWDBM NAD HEENT-WNL COR-RRR Lungs clear Ext--no CCE       Assessment & Plan:  HTN Controlled MDD/PTSD Per psych. GERD Fatigue Labs ok--multifactorial. RTC 6onths. I have done the exam and reviewed the chart and it is accurate to the best of my knowledge. DentistDragon  technology has been used and  any errors in dictation or transcription are unintentional. Sigmund Hazelichard  M.D.

## 2017-11-01 NOTE — Progress Notes (Signed)
   Subjective:    Patient ID: Jose May, male    DOB: 09-14-1965, 52 y.o.   MRN: [id number]  HPI    Review of Systems     Objective:   Physical Exam        Assessment & Plan:

## 2017-11-22 ENCOUNTER — Ambulatory Visit: Payer: Medicaid Other | Admitting: Ophthalmology

## 2017-12-05 ENCOUNTER — Ambulatory Visit: Payer: Medicaid Other | Admitting: Ophthalmology

## 2018-03-10 ENCOUNTER — Other Ambulatory Visit: Payer: Self-pay

## 2018-03-10 ENCOUNTER — Encounter: Payer: Self-pay | Admitting: Emergency Medicine

## 2018-03-10 DIAGNOSIS — K449 Diaphragmatic hernia without obstruction or gangrene: Secondary | ICD-10-CM | POA: Insufficient documentation

## 2018-03-10 DIAGNOSIS — F319 Bipolar disorder, unspecified: Secondary | ICD-10-CM | POA: Insufficient documentation

## 2018-03-10 DIAGNOSIS — F419 Anxiety disorder, unspecified: Secondary | ICD-10-CM | POA: Insufficient documentation

## 2018-03-10 DIAGNOSIS — Z79899 Other long term (current) drug therapy: Secondary | ICD-10-CM | POA: Insufficient documentation

## 2018-03-10 DIAGNOSIS — I10 Essential (primary) hypertension: Secondary | ICD-10-CM | POA: Insufficient documentation

## 2018-03-10 NOTE — ED Triage Notes (Addendum)
Pt reports intermittent upper abd pain, more so to RUQ and right flank,  x 2-3 days with nausea; urinary frequency; pt says when he tries he lay on his right side the pain seems worse;

## 2018-03-11 ENCOUNTER — Emergency Department: Payer: Self-pay

## 2018-03-11 ENCOUNTER — Emergency Department
Admission: EM | Admit: 2018-03-11 | Discharge: 2018-03-11 | Disposition: A | Discharge status: Home / Self Care | Payer: Self-pay | Attending: Emergency Medicine | Admitting: Emergency Medicine

## 2018-03-11 DIAGNOSIS — R52 Pain, unspecified: Secondary | ICD-10-CM

## 2018-03-11 DIAGNOSIS — K449 Diaphragmatic hernia without obstruction or gangrene: Secondary | ICD-10-CM

## 2018-03-11 DIAGNOSIS — R1011 Right upper quadrant pain: Secondary | ICD-10-CM

## 2018-03-11 LAB — CBC
HEMATOCRIT: 42.9 % (ref 40.0–52.0)
Hemoglobin: 14.4 g/dL (ref 13.0–18.0)
MCH: 26.8 pg (ref 26.0–34.0)
MCHC: 33.6 g/dL (ref 32.0–36.0)
MCV: 79.8 fL — ABNORMAL LOW (ref 80.0–100.0)
Platelets: 325 10*3/uL (ref 150–440)
RBC: 5.38 MIL/uL (ref 4.40–5.90)
RDW: 15.8 % — AB (ref 11.5–14.5)
WBC: 8.5 10*3/uL (ref 3.8–10.6)

## 2018-03-11 LAB — COMPREHENSIVE METABOLIC PANEL
ALBUMIN: 4 g/dL (ref 3.5–5.0)
ALK PHOS: 85 U/L (ref 38–126)
ALT: 11 U/L (ref 0–44)
AST: 17 U/L (ref 15–41)
Anion gap: 3 — ABNORMAL LOW (ref 5–15)
BILIRUBIN TOTAL: 0.5 mg/dL (ref 0.3–1.2)
BUN: 12 mg/dL (ref 6–20)
CALCIUM: 8.9 mg/dL (ref 8.9–10.3)
CO2: 30 mmol/L (ref 22–32)
CREATININE: 1.15 mg/dL (ref 0.61–1.24)
Chloride: 106 mmol/L (ref 98–111)
GFR calc Af Amer: >60 mL/min (ref >60)
GLUCOSE: 110 mg/dL — AB (ref 70–99)
Potassium: 4 mmol/L (ref 3.5–5.1)
Sodium: 139 mmol/L (ref 135–145)
TOTAL PROTEIN: 7.9 g/dL (ref 6.5–8.1)

## 2018-03-11 LAB — URINALYSIS, COMPLETE (UACMP) WITH MICROSCOPIC
Bilirubin Urine: NEGATIVE
Glucose, UA: NEGATIVE mg/dL
Hgb urine dipstick: NEGATIVE
Ketones, ur: 5 mg/dL — AB
Leukocytes, UA: NEGATIVE
Nitrite: NEGATIVE
PH: 5 (ref 5.0–8.0)
Protein, ur: NEGATIVE mg/dL
SPECIFIC GRAVITY, URINE: 1.029 (ref 1.005–1.030)
SQUAMOUS EPITHELIAL / LPF: NONE SEEN (ref 0–5)

## 2018-03-11 LAB — LIPASE, BLOOD: Lipase: 25 U/L (ref 11–51)

## 2018-03-11 LAB — TROPONIN I

## 2018-03-11 MED ORDER — ONDANSETRON 4 MG PO TBDP
4.0000 mg | ORAL_TABLET | Freq: Once | ORAL | Status: AC
  Administered 2018-03-11: 4 mg via ORAL
  Filled 2018-03-11: qty 1

## 2018-03-11 MED ORDER — IBUPROFEN 600 MG PO TABS: 600.0000 mg | ORAL_TABLET | Freq: Three times a day (TID) | ORAL | 0 refills | Status: DC | PRN

## 2018-03-11 MED ORDER — DICYCLOMINE HCL 20 MG PO TABS: 20.0000 mg | ORAL_TABLET | Freq: Three times a day (TID) | ORAL | 0 refills | Status: DC | PRN

## 2018-03-11 MED ORDER — IBUPROFEN 600 MG PO TABS
600.0000 mg | ORAL_TABLET | Freq: Once | ORAL | Status: AC
  Administered 2018-03-11: 600 mg via ORAL
  Filled 2018-03-11: qty 1

## 2018-03-11 NOTE — ED Notes (Signed)
Patient reports having symptoms for "couple" days.  Reports right upper quad abdominal pain and right flank pain.  Patient reports nausea, but denies vomiting, also reports urinary frequency.

## 2018-03-11 NOTE — Discharge Instructions (Signed)
It was a pleasure to take care of you today, and thank you for coming to our emergency department.  If you have any questions or concerns before leaving please ask the nurse to grab me and I'm more than happy to go through your aftercare instructions again.  If you were prescribed any opioid pain medication today such as Norco, Vicodin, Percocet, morphine, hydrocodone, or oxycodone please make sure you do not drive when you are taking this medication as it can alter your ability to drive safely.  If you have any concerns once you are home that you are not improving or are in fact getting worse before you can make it to your follow-up appointment, please do not hesitate to call 911 and come back for further evaluation.  Merrily BrittleNeil Damond Borchers, MD  Results for orders placed or performed during the hospital encounter of 03/11/18  Lipase, blood  Result Value Ref Range   Lipase 25 11 - 51 U/L  Comprehensive metabolic panel  Result Value Ref Range   Sodium 139 135 - 145 mmol/L   Potassium 4.0 3.5 - 5.1 mmol/L   Chloride 106 98 - 111 mmol/L   CO2 30 22 - 32 mmol/L   Glucose, Bld 110 (H) 70 - 99 mg/dL   BUN 12 6 - 20 mg/dL   Creatinine, Ser 7.841.15 0.61 - 1.24 mg/dL   Calcium 8.9 8.9 - 69.610.3 mg/dL   Total Protein 7.9 6.5 - 8.1 g/dL   Albumin 4.0 3.5 - 5.0 g/dL   AST 17 15 - 41 U/L   ALT 11 0 - 44 U/L   Alkaline Phosphatase 85 38 - 126 U/L   Total Bilirubin 0.5 0.3 - 1.2 mg/dL   GFR calc non Af Amer >60 >60 mL/min   GFR calc Af Amer >60 >60 mL/min   Anion gap 3 (L) 5 - 15  CBC  Result Value Ref Range   WBC 8.5 3.8 - 10.6 K/uL   RBC 5.38 4.40 - 5.90 MIL/uL   Hemoglobin 14.4 13.0 - 18.0 g/dL   HCT 29.542.9 28.440.0 - 13.252.0 %   MCV 79.8 (L) 80.0 - 100.0 fL   MCH 26.8 26.0 - 34.0 pg   MCHC 33.6 32.0 - 36.0 g/dL   RDW 44.015.8 (H) 10.211.5 - 72.514.5 %   Platelets 325 150 - 440 K/uL  Urinalysis, Complete w Microscopic  Result Value Ref Range   Color, Urine YELLOW (A) YELLOW   APPearance CLEAR (A) CLEAR   Specific  Gravity, Urine 1.029 1.005 - 1.030   pH 5.0 5.0 - 8.0   Glucose, UA NEGATIVE NEGATIVE mg/dL   Hgb urine dipstick NEGATIVE NEGATIVE   Bilirubin Urine NEGATIVE NEGATIVE   Ketones, ur 5 (A) NEGATIVE mg/dL   Protein, ur NEGATIVE NEGATIVE mg/dL   Nitrite NEGATIVE NEGATIVE   Leukocytes, UA NEGATIVE NEGATIVE   RBC / HPF 0-5 0 - 5 RBC/hpf   WBC, UA 0-5 0 - 5 WBC/hpf   Bacteria, UA RARE (A) NONE SEEN   Squamous Epithelial / LPF NONE SEEN 0 - 5   Mucus PRESENT   Troponin I  Result Value Ref Range   Troponin I <0.03 <0.03 ng/mL   Ct Abdomen Pelvis Wo Contrast  Result Date: 03/11/2018 CLINICAL DATA:  Intermittent upper abdominal pain more so in the right upper quadrant and right flank times 2-3 days. Nausea. EXAM: CT ABDOMEN AND PELVIS WITHOUT CONTRAST TECHNIQUE: Multidetector CT imaging of the abdomen and pelvis was performed following the standard protocol without  IV contrast. COMPARISON:  None. FINDINGS: Lower chest: No acute abnormality. Right basilar subsegmental atelectasis. Hepatobiliary: Given limitations of a noncontrast study, the unenhanced liver is unremarkable. Decompressed gallbladder without stones. No biliary dilatation is identified. Pancreas: Normal Spleen: Normal Adrenals/Urinary Tract: Normal bilateral adrenal glands and kidneys without obstructive uropathy, mass nor inflammation. The urinary bladder is unremarkable for the degree of distention. Stomach/Bowel: Small to moderate-sized hiatal hernia. Decompressed stomach with normal small bowel rotation. No bowel obstruction. Distal and terminal ileum as well as appendix are normal. Increased colonic stool burden is noted from cecum through distal transverse colon. No bowel thickening or inflammation is identified. Vascular/Lymphatic: Nonaneurysmal aorta.  No lymphadenopathy. Reproductive: Prostate and seminal vesicles are unremarkable. Other: No abdominal wall hernia or abnormality. No abdominopelvic ascites. Musculoskeletal: No acute or  significant osseous findings. IMPRESSION: 1. Small to moderate-sized hiatal hernia. 2. No bowel obstruction nor inflammation. Increased colonic stool burden is noted. Electronically Signed   By: Tollie Eth M.D.   On: 03/11/2018 03:47   Dg Chest 2 View  Result Date: 03/11/2018 CLINICAL DATA:  Intermittent upper abdominal pain more so in the right upper quadrant and right flank. EXAM: CHEST - 2 VIEW COMPARISON:  None. FINDINGS: The heart size and mediastinal contours are within normal limits. Both lungs are clear. No free air beneath the diaphragms. No pathologic calcifications identified within the included upper abdomen. The visualized skeletal structures are unremarkable. IMPRESSION: No active cardiopulmonary disease. Electronically Signed   By: Tollie Eth M.D.   On: 03/11/2018 00:32   US Abdomen Limited Ruq  Result Date: 03/11/2018 CLINICAL DATA:  52 y/o M; 2-3 days of right upper quadrant abdominal pain and nausea. EXAM: ULTRASOUND ABDOMEN LIMITED RIGHT UPPER QUADRANT COMPARISON:  03/25/2017 CT abdomen and pelvis. FINDINGS: Gallbladder: No gallstones or wall thickening visualized. No sonographic Murphy sign noted by sonographer. Common bile duct: Diameter: 3.4 mm Liver: No focal lesion identified. Within normal limits in parenchymal echogenicity. Portal vein is patent on color Doppler imaging with normal direction of blood flow towards the liver. IMPRESSION: Normal right upper quadrant ultrasound. Electronically Signed   By: Mitzi Hansen M.D.   On: 03/11/2018 02:19

## 2018-03-11 NOTE — ED Provider Notes (Signed)
[facility] Emergency Department Provider Note  ____________________________________________   First MD Initiated Contact with Patient 03/11/18 0138     (approximate)  I have reviewed the triage vital signs and the nursing notes.   HISTORY  Chief Complaint Abdominal Pain   HPI Jose May is a 52 y.o. male who self presents to the emergency department with 2 or 3 days of upper abdominal pain radiating towards his right upper quadrant.  The pain is moderate severity intermittent cramping and aching.  Associated with some nausea although no vomiting.  No diarrhea.  Pain is worse when leaning on his right side.  He is noted no rash.  No chest pain or shortness of breath.  No history of abdominal surgeries.  No testicular pain.  He has taken neck Tylenol at home with minimal relief.  He denies hematuria.    Past Medical History:  Diagnosis Date  . Anxiety   . Bipolar 1 disorder (HCC)   . Hypertension   . PTSD (post-traumatic stress disorder)     Patient Active Problem List   Diagnosis Date Noted  . GERD (gastroesophageal reflux disease) 11/01/2017  . HTN (hypertension) 08/30/2017    History reviewed. No pertinent surgical history.  Prior to Admission medications   Medication Sig Start Date End Date Taking? Authorizing Provider  dicyclomine (BENTYL) 20 MG tablet Take 1 tablet (20 mg total) by mouth 3 (three) times daily as needed for spasms. 03/11/18 03/11/19  Merrily Brittleifenbark, Samira Acero, MD  ibuprofen (ADVIL,MOTRIN) 600 MG tablet Take 1 tablet (600 mg total) by mouth every 8 (eight) hours as needed. 03/11/18   Merrily Brittleifenbark, Keithen Capo, MD  losartan (COZAAR) 50 MG tablet Take 1 tablet (50 mg total) by mouth daily. 08/30/17   Maple HudsonGilbert, Richard L Jr., MD  ondansetron (ZOFRAN) 4 MG tablet Take 1 tablet (4 mg total) by mouth every 8 (eight) hours as needed for nausea or vomiting. 03/25/17   Don PerkingVeronese, [place], MD  oxyCODONE-acetaminophen (ROXICET) 5-325 MG tablet Take 1  tablet by mouth every 6 (six) hours as needed. 03/25/17 03/25/18  Nita SickleVeronese, [place], MD  QUEtiapine (SEROQUEL) 100 MG tablet Take 1 tablet (100 mg total) by mouth at bedtime. 03/11/16 03/25/17  Triplett, Kasandra Knudsenari B, FNP  ranitidine (ZANTAC) 150 MG tablet Take 1 tablet (150 mg total) by mouth 2 (two) times daily. 08/14/17   Kerman PasseyLada, Melinda P, MD  traZODone (DESYREL) 100 MG tablet Take 100 mg by mouth at bedtime.    [provider]    Allergies Coconut flavor; Eggs or egg-derived products; Tomato; and Wheat bran  Family History  Problem Relation Age of Onset  . Diabetes Mother   . Hypertension Mother   . Hypertension Father     Social History Social History   Tobacco Use  . Smoking status: Never Smoker  . Smokeless tobacco: Never Used  Substance Use Topics  . Alcohol use: No  . Drug use: No    Review of Systems Constitutional: No fever/chills Eyes: No visual changes. ENT: No sore throat. Cardiovascular: Denies chest pain. Respiratory: Denies shortness of breath. Gastrointestinal: No abdominal pain.  No nausea, no vomiting.  No diarrhea.  No constipation. Genitourinary: Negative for dysuria. Musculoskeletal: Negative for back pain. Skin: Negative for rash. Neurological: Negative for headaches, focal weakness or numbness.   ____________________________________________   PHYSICAL EXAM:  VITAL SIGNS: ED Triage Vitals  Enc Vitals Group     BP 03/10/18 2346 113/86     Pulse Rate 03/10/18 2346 71  Resp 03/10/18 2346 16     Temp 03/10/18 2346 98.7 F (37.1 C)     Temp Source 03/10/18 2346 Oral     SpO2 03/10/18 2346 98 %     Weight 03/10/18 2347 178 lb (80.7 kg)     Height 03/10/18 2347 5\' 9"  (1.753 m)     Head Circumference --      Peak Flow --      Pain Score 03/10/18 2346 7     Pain Loc --      Pain Edu? --      Excl. in GC? --     Constitutional: Alert and oriented x4 appears somewhat uncomfortable although nontoxic no diaphoresis speaks full clear  sentences Eyes: PERRL EOMI. Head: Atraumatic. Nose: No congestion/rhinnorhea. Mouth/Throat: No trismus Neck: No stridor.   Cardiovascular: Normal rate, regular rhythm. Grossly normal heart sounds.  Good peripheral circulation. Respiratory: Normal respiratory effort.  No retractions. Lungs CTAB and moving good air Gastrointestinal: Soft abdomen somewhat tender epigastric no rebound or guarding no peritonitis Musculoskeletal: No lower extremity edema   Neurologic:  Normal speech and language. No gross focal neurologic deficits are appreciated. Skin:  Skin is warm, dry and intact. No rash noted. Psychiatric: Mood and affect are normal. Speech and behavior are normal.    ____________________________________________   DIFFERENTIAL includes but not limited to  Biliary colic, cholecystitis, appendicitis, nephrolithiasis ____________________________________________   LABS (all labs ordered are listed, but only abnormal results are displayed)  Labs Reviewed  COMPREHENSIVE METABOLIC PANEL - Abnormal; Notable for the following components:      Result Value   Glucose, Bld 110 (*)    Anion gap 3 (*)    All other components within normal limits  CBC - Abnormal; Notable for the following components:   MCV 79.8 (*)    RDW 15.8 (*)    All other components within normal limits  URINALYSIS, COMPLETE (UACMP) WITH MICROSCOPIC - Abnormal; Notable for the following components:   Color, Urine YELLOW (*)    APPearance CLEAR (*)    Ketones, ur 5 (*)    Bacteria, UA RARE (*)    All other components within normal limits  LIPASE, BLOOD  TROPONIN I    Lab work reviewed by me with no acute disease appreciated __________________________________________  EKG  ED ECG REPORT I, Merrily Brittle, the attending physician, personally viewed and interpreted this ECG.  Date: 03/11/2018 EKG Time:  Rate: 66 Rhythm: normal sinus rhythm QRS Axis: normal Intervals: normal ST/T Wave abnormalities:  normal Narrative Interpretation: no evidence of acute ischemia  ____________________________________________  RADIOLOGY  CT abdomen pelvis reviewed by me with moderate hiatal hernia otherwise unremarkable Right upper quadrant ultrasound reviewed by me with no acute disease Chest x-ray reviewed by me with no acute disease ____________________________________________   PROCEDURES  Procedure(s) performed: no  Procedures  Critical Care performed: no  ____________________________________________   INITIAL IMPRESSION / ASSESSMENT AND PLAN / ED COURSE  Pertinent labs & imaging results that were available during my care of the patient were reviewed by me and considered in my medical decision making (see chart for details).   As part of my medical decision making, I reviewed the following data within the electronic MEDICAL RECORD NUMBER History obtained from family if available, nursing notes, old chart and ekg, as well as notes from prior ED visits.  Patient arrives with epigastric and right upper quadrant pain.  Ultrasound obtained which is negative for gallstones I progressed onto CT scan which  shows only hiatal hernia but is otherwise unremarkable.  Given ibuprofen for pain which seemed to help his symptoms.  Unable to provide opioids as he is the only when driving home.  I will discharge him home with a short course of dicyclomine as well as ibuprofen and refer him back to primary care.  He is able to eat and drink and his pain is adequately controlled.      ____________________________________________   FINAL CLINICAL IMPRESSION(S) / ED DIAGNOSES  Final diagnoses:  Pain  Right upper quadrant abdominal pain  Hiatal hernia      NEW MEDICATIONS STARTED DURING THIS VISIT:  New Prescriptions   DICYCLOMINE (BENTYL) 20 MG TABLET    Take 1 tablet (20 mg total) by mouth 3 (three) times daily as needed for spasms.   IBUPROFEN (ADVIL,MOTRIN) 600 MG TABLET    Take 1 tablet (600 mg  total) by mouth every 8 (eight) hours as needed.     Note:  This document was prepared using Dragon voice recognition software and may include unintentional dictation errors.     Merrily Brittle, MD 03/11/18 0600

## 2018-05-02 ENCOUNTER — Ambulatory Visit: Payer: Medicaid Other | Admitting: Adult Health Nurse Practitioner

## 2018-05-02 VITALS — BP 126/87 | HR 94 | Temp 98.6°F | Ht 68.0 in | Wt 170.2 lb

## 2018-05-02 DIAGNOSIS — K219 Gastro-esophageal reflux disease without esophagitis: Secondary | ICD-10-CM

## 2018-05-02 DIAGNOSIS — I10 Essential (primary) hypertension: Secondary | ICD-10-CM

## 2018-05-02 MED ORDER — OMEPRAZOLE 20 MG PO CPDR: 20.0000 mg | DELAYED_RELEASE_CAPSULE | Freq: Every day | ORAL | 3 refills | Status: DC

## 2018-05-02 NOTE — Progress Notes (Signed)
  Patient: Jose May Male    DOB: 1965/11/16   52 y.o.   MRN: [id number] Visit Date: 05/02/2018  Today's Provider: Jacelyn Pi, NP   Chief Complaint  Patient presents with  . Follow-up   Subjective:    HPI   Taking medications as directed.   Wife reports that patient is not taking Zantac because it increases his anxiety, she has been giving him Omeprazole with good results.   Allergies  Allergen Reactions  . Coconut Flavor   . Eggs Or Egg-Derived Products   . Tomato Itching  . Wheat Bran    Previous Medications   DICYCLOMINE (BENTYL) 20 MG TABLET    Take 1 tablet (20 mg total) by mouth 3 (three) times daily as needed for spasms.   IBUPROFEN (ADVIL,MOTRIN) 600 MG TABLET    Take 1 tablet (600 mg total) by mouth every 8 (eight) hours as needed.   LOSARTAN (COZAAR) 50 MG TABLET    Take 1 tablet (50 mg total) by mouth daily.   ONDANSETRON (ZOFRAN) 4 MG TABLET    Take 1 tablet (4 mg total) by mouth every 8 (eight) hours as needed for nausea or vomiting.   QUETIAPINE (SEROQUEL) 100 MG TABLET    Take 1 tablet (100 mg total) by mouth at bedtime.   RANITIDINE (ZANTAC) 150 MG TABLET    Take 1 tablet (150 mg total) by mouth 2 (two) times daily.   TRAZODONE (DESYREL) 100 MG TABLET    Take 100 mg by mouth at bedtime.    Review of Systems  All other systems reviewed and are negative.   Social History   Tobacco Use  . Smoking status: Never Smoker  . Smokeless tobacco: Never Used  Substance Use Topics  . Alcohol use: No   Objective:   BP 126/87 (BP Location: Right Arm, Patient Position: Sitting)   Pulse 94   Temp 98.6 F (37 C)   Ht 5\' 8"  (1.727 m)   Wt 170 lb 3.2 oz (77.2 kg)   BMI 25.88 kg/m   Physical Exam  Constitutional: He is oriented to person, place, and time. He appears well-developed and well-nourished.  HENT:  Head: Normocephalic and atraumatic.  Neck: Normal range of motion. Neck supple.  Cardiovascular: Normal rate, regular rhythm and normal  heart sounds.  Pulmonary/Chest: Effort normal and breath sounds normal.  Abdominal: Soft. Bowel sounds are normal.  Neurological: He is alert and oriented to person, place, and time.  Skin: Skin is warm and dry.        Assessment & Plan:         HTN:  Controlled.   Goal BP <130/80.  Continue current medication regimen.  Encourage low salt diet and exercise.    GERD:  Will change Zantac to Omeprazole. Limit trigger foods.  Continue RHA for [employer].   Reviewed labs from hospital in August.  Next visit check A1c due to increased glucose on CMET.    Jacelyn Pi, NP   [facility]

## 2018-06-02 ENCOUNTER — Encounter: Payer: Self-pay | Admitting: Emergency Medicine

## 2018-06-02 ENCOUNTER — Other Ambulatory Visit: Payer: Self-pay

## 2018-06-02 DIAGNOSIS — S29012A Strain of muscle and tendon of back wall of thorax, initial encounter: Secondary | ICD-10-CM | POA: Insufficient documentation

## 2018-06-02 DIAGNOSIS — Y9289 Other specified places as the place of occurrence of the external cause: Secondary | ICD-10-CM | POA: Insufficient documentation

## 2018-06-02 DIAGNOSIS — Z79899 Other long term (current) drug therapy: Secondary | ICD-10-CM | POA: Insufficient documentation

## 2018-06-02 DIAGNOSIS — M62830 Muscle spasm of back: Secondary | ICD-10-CM | POA: Insufficient documentation

## 2018-06-02 DIAGNOSIS — Y9389 Activity, other specified: Secondary | ICD-10-CM | POA: Insufficient documentation

## 2018-06-02 DIAGNOSIS — Y33XXXA Other specified events, undetermined intent, initial encounter: Secondary | ICD-10-CM | POA: Insufficient documentation

## 2018-06-02 DIAGNOSIS — I10 Essential (primary) hypertension: Secondary | ICD-10-CM | POA: Insufficient documentation

## 2018-06-02 DIAGNOSIS — Y990 Civilian activity done for income or pay: Secondary | ICD-10-CM | POA: Insufficient documentation

## 2018-06-02 NOTE — ED Triage Notes (Signed)
Back pain that ibuprofen not helping x 3 days.

## 2018-06-03 ENCOUNTER — Emergency Department
Admission: EM | Admit: 2018-06-03 | Discharge: 2018-06-03 | Disposition: A | Discharge status: Home / Self Care | Payer: Medicaid Other | Attending: Emergency Medicine | Admitting: Emergency Medicine

## 2018-06-03 DIAGNOSIS — T148XXA Other injury of unspecified body region, initial encounter: Secondary | ICD-10-CM

## 2018-06-03 DIAGNOSIS — M62830 Muscle spasm of back: Secondary | ICD-10-CM

## 2018-06-03 MED ORDER — DIAZEPAM 2 MG PO TABS
2.0000 mg | ORAL_TABLET | Freq: Once | ORAL | Status: AC
  Administered 2018-06-03: 2 mg via ORAL
  Filled 2018-06-03: qty 1

## 2018-06-03 MED ORDER — IBUPROFEN 800 MG PO TABS
800.0000 mg | ORAL_TABLET | Freq: Once | ORAL | Status: AC
  Administered 2018-06-03: 800 mg via ORAL
  Filled 2018-06-03: qty 1

## 2018-06-03 MED ORDER — HYDROCODONE-ACETAMINOPHEN 5-325 MG PO TABS
1.0000 | ORAL_TABLET | Freq: Once | ORAL | Status: AC
  Administered 2018-06-03: 1 via ORAL
  Filled 2018-06-03: qty 1

## 2018-06-03 MED ORDER — NAPROXEN 500 MG PO TABS: 500.0000 mg | ORAL_TABLET | Freq: Two times a day (BID) | ORAL | 0 refills | Status: DC

## 2018-06-03 MED ORDER — HYDROCODONE-ACETAMINOPHEN 5-325 MG PO TABS: 1.0000 | ORAL_TABLET | Freq: Four times a day (QID) | ORAL | 0 refills | Status: DC | PRN

## 2018-06-03 MED ORDER — DIAZEPAM 2 MG PO TABS: 2.0000 mg | ORAL_TABLET | Freq: Three times a day (TID) | ORAL | 0 refills | Status: DC | PRN

## 2018-06-03 NOTE — ED Notes (Signed)
Family given update on wait time. Verbalizes understanding.

## 2018-06-03 NOTE — ED Provider Notes (Signed)
[facility] Emergency Department Provider Note   ____________________________________________   First MD Initiated Contact with Patient 06/03/18 [phone]     (approximate)  I have reviewed the triage vital signs and the nursing notes.   HISTORY  Chief Complaint Back Pain    HPI Jose May is a 52 y.o. male who presents to the ED from home with a chief complaint of nontraumatic back pain.  Patient works third shift in a [employer].  Works long shifts with repetitive twisting type movements.  Complains of pain and muscle spasms in his thoracic back, left greater than right.  Denies recent fever, chills, chest pain, shortness of breath, abdominal pain, nausea or vomiting.  Denies recent travel or trauma.  Similar symptoms in September when he was evaluated at [facility].  At the time he was treated with lidocaine patch, Valium and Toradol.  States the Valium helped the most.  Denies extremity weakness, numbness or tingling.  Denies bowel or bladder incontinence.   Past Medical History:  Diagnosis Date  . Anxiety   . Bipolar 1 disorder (HCC)   . Hypertension   . PTSD (post-traumatic stress disorder)     Patient Active Problem List   Diagnosis Date Noted  . GERD (gastroesophageal reflux disease) 11/01/2017  . HTN (hypertension) 08/30/2017    History reviewed. No pertinent surgical history.  Prior to Admission medications   Medication Sig Start Date End Date Taking? Authorizing Provider  diazepam (VALIUM) 2 MG tablet Take 1 tablet (2 mg total) by mouth every 8 (eight) hours as needed for muscle spasms. 06/03/18   Irean Hong, MD  dicyclomine (BENTYL) 20 MG tablet Take 1 tablet (20 mg total) by mouth 3 (three) times daily as needed for spasms. 03/11/18 03/11/19  Merrily Brittle, MD  HYDROcodone-acetaminophen (NORCO) 5-325 MG tablet Take 1 tablet by mouth every 6 (six) hours as needed for moderate pain. 06/03/18   Irean Hong, MD    ibuprofen (ADVIL,MOTRIN) 600 MG tablet Take 1 tablet (600 mg total) by mouth every 8 (eight) hours as needed. 03/11/18   Merrily Brittle, MD  losartan (COZAAR) 50 MG tablet Take 1 tablet (50 mg total) by mouth daily. 08/30/17   Maple Hudson., MD  naproxen (NAPROSYN) 500 MG tablet Take 1 tablet (500 mg total) by mouth 2 (two) times daily with a meal. 06/03/18   Irean Hong, MD  omeprazole (PRILOSEC) 20 MG capsule Take 1 capsule (20 mg total) by mouth daily. 05/02/18   Doles-Johnson, Teah, NP  ondansetron (ZOFRAN) 4 MG tablet Take 1 tablet (4 mg total) by mouth every 8 (eight) hours as needed for nausea or vomiting. 03/25/17   Don Perking, [place], MD  QUEtiapine (SEROQUEL) 100 MG tablet Take 1 tablet (100 mg total) by mouth at bedtime. 03/11/16 03/25/17  Triplett, Rulon Eisenmenger B, FNP  traZODone (DESYREL) 100 MG tablet Take 100 mg by mouth at bedtime.    [provider]    Allergies Coconut flavor; Eggs or egg-derived products; Tomato; and Wheat bran  Family History  Problem Relation Age of Onset  . Diabetes Mother   . Hypertension Mother   . Hypertension Father     Social History Social History   Tobacco Use  . Smoking status: Never Smoker  . Smokeless tobacco: Never Used  Substance Use Topics  . Alcohol use: No  . Drug use: No    Review of Systems  Constitutional: No fever/chills Eyes: No visual changes. ENT:  No sore throat. Cardiovascular: Denies chest pain. Respiratory: Denies shortness of breath. Gastrointestinal: No abdominal pain.  No nausea, no vomiting.  No diarrhea.  No constipation. Genitourinary: Negative for dysuria. Musculoskeletal: Positive for back pain. Skin: Negative for rash. Neurological: Negative for headaches, focal weakness or numbness.   ____________________________________________   PHYSICAL EXAM:  VITAL SIGNS: ED Triage Vitals  Enc Vitals Group     BP 06/02/18 2233 (!) 125/94     Pulse Rate 06/02/18 2233 72     Resp 06/02/18 2233 16      Temp 06/02/18 2233 98.1 F (36.7 C)     Temp src --      SpO2 06/02/18 2233 97 %     Weight 06/02/18 2230 170 lb (77.1 kg)     Height 06/02/18 2230 5\' 9"  (1.753 m)     Head Circumference --      Peak Flow --      Pain Score 06/02/18 2230 8     Pain Loc --      Pain Edu? --      Excl. in GC? --     Constitutional: Alert and oriented. Well appearing and in no acute distress. Eyes: Conjunctivae are normal. PERRL. EOMI. Head: Atraumatic. Nose: No congestion/rhinnorhea. Mouth/Throat: Mucous membranes are moist.  Oropharynx non-erythematous. Neck: No stridor.  No cervical spine tenderness to palpation. Cardiovascular: Normal rate, regular rhythm. Grossly normal heart sounds.  Good peripheral circulation. Respiratory: Normal respiratory effort.  No retractions. Lungs CTAB. Gastrointestinal: Soft and nontender. No distention. No abdominal bruits. No CVA tenderness. Musculoskeletal: Spinal tenderness to palpation.  Left thoracic paraspinal muscle spasms.  No lower extremity tenderness nor edema.  No joint effusions. Neurologic:  Normal speech and language. No gross focal neurologic deficits are appreciated. No gait instability. Skin:  Skin is warm, dry and intact. No rash noted. Psychiatric: Mood and affect are normal. Speech and behavior are normal.  ____________________________________________   LABS (all labs ordered are listed, but only abnormal results are displayed)  Labs Reviewed - No data to display ____________________________________________  EKG  None ____________________________________________  RADIOLOGY  ED MD interpretation: None  Official radiology report(s): No results found.  ____________________________________________   PROCEDURES  Procedure(s) performed: None  Procedures  Critical Care performed: No  ____________________________________________   INITIAL IMPRESSION / ASSESSMENT AND PLAN / ED COURSE  As part of my medical decision making, I  reviewed the following data within the electronic MEDICAL RECORD NUMBER History obtained from family, Nursing notes reviewed and incorporated, Old chart reviewed and Notes from prior ED visits   52 year old male who presents with thoracic sprain.  No focal neurological deficits.  Will treat with NSAIDs, analgesia, muscle relaxer.  Encouraged orthopedic follow-up this week.  Strict return precautions given.  Patient and spouse verbalize understanding and agree with plan of care.      ____________________________________________   FINAL CLINICAL IMPRESSION(S) / ED DIAGNOSES  Final diagnoses:  Muscle strain  Muscle spasm of back     ED Discharge Orders         Ordered    naproxen (NAPROSYN) 500 MG tablet  2 times daily with meals     06/03/18 0444    HYDROcodone-acetaminophen (NORCO) 5-325 MG tablet  Every 6 hours PRN     06/03/18 0444    diazepam (VALIUM) 2 MG tablet  Every 8 hours PRN     06/03/18 0444           Note:  This document was prepared  using Conservation officer, historic buildings and may include unintentional dictation errors.    Irean Hong, MD 06/03/18 [phone]

## 2018-06-03 NOTE — ED Notes (Signed)
Reviewed discharge instructions, follow-up care, and prescriptions with patient. Patient verbalized understanding of all information reviewed. Patient stable, with no distress noted at this time.    

## 2018-06-03 NOTE — Discharge Instructions (Signed)
1.  You may take medicines as needed for pain and muscle spasms (Naprosyn/Norco/Valium #15). 2.  Apply moist heat to affected area several times daily. 3.  Return to the ER for worsening symptoms, persistent vomiting, difficulty breathing or other concerns.

## 2018-06-03 NOTE — ED Notes (Signed)
No peripheral IV placed this visit.   Discharge instructions reviewed with patient. Questions fielded by this RN. Patient verbalizes understanding of instructions. Patient discharged home in stable condition per sung. No acute distress noted at time of discharge.   Pt DC with sig other

## 2018-06-04 ENCOUNTER — Other Ambulatory Visit: Payer: Self-pay

## 2018-06-04 MED ORDER — LOSARTAN POTASSIUM 50 MG PO TABS: 50.0000 mg | ORAL_TABLET | Freq: Every day | ORAL | 5 refills | Status: DC

## 2018-07-03 ENCOUNTER — Telehealth: Payer: Self-pay | Admitting: Pharmacy Technician

## 2018-07-03 NOTE — Telephone Encounter (Signed)
Patient has Nurse, learning disabilitycommercial insurance.  No longer meets [facility]'s eligibility criteria.  Pt notified.  Sherilyn DacostaBetty J.  Care Manager [facility]

## 2018-07-17 DIAGNOSIS — M545 Low back pain: Secondary | ICD-10-CM | POA: Diagnosis not present

## 2018-07-17 DIAGNOSIS — M546 Pain in thoracic spine: Secondary | ICD-10-CM | POA: Diagnosis not present

## 2018-07-17 DIAGNOSIS — I10 Essential (primary) hypertension: Secondary | ICD-10-CM | POA: Diagnosis not present

## 2018-10-31 ENCOUNTER — Other Ambulatory Visit: Payer: Self-pay | Admitting: Family Medicine

## 2018-11-06 ENCOUNTER — Ambulatory Visit: Payer: Medicaid Other | Admitting: Internal Medicine

## 2018-11-06 DIAGNOSIS — K219 Gastro-esophageal reflux disease without esophagitis: Secondary | ICD-10-CM

## 2018-11-06 DIAGNOSIS — I10 Essential (primary) hypertension: Secondary | ICD-10-CM

## 2018-11-06 MED ORDER — LOSARTAN POTASSIUM 50 MG PO TABS: 50.0000 mg | ORAL_TABLET | Freq: Every day | ORAL | 0 refills | Status: DC

## 2018-11-06 MED ORDER — LOSARTAN POTASSIUM 50 MG PO TABS
50.0000 mg | ORAL_TABLET | Freq: Every day | ORAL | 3 refills | Status: DC
Start: 2018-11-06 — End: 2018-11-06

## 2018-11-06 MED ORDER — OMEPRAZOLE 20 MG PO CPDR
20.0000 mg | DELAYED_RELEASE_CAPSULE | Freq: Every day | ORAL | 1 refills | Status: DC
Start: 2018-11-06 — End: 2018-11-06

## 2018-11-06 MED ORDER — OMEPRAZOLE 20 MG PO CPDR: 20.0000 mg | DELAYED_RELEASE_CAPSULE | Freq: Every day | ORAL | 0 refills | Status: AC

## 2018-11-06 NOTE — Progress Notes (Addendum)
Subjective:    Patient ID: Jose May, male    DOB: 1966/07/05, 53 y.o.   MRN: [id number]  HPI   Patient is a 53 year old male who virtually presents for follow up and medication refills. Patient sought medical advice regarding COVID-19.  Patient states that he works at a facility where a co-worker who works a different shift came in contact one month ago with someone who tested COVID-19 positive. Patient unaware whether exposed co-worker had symptoms and exposed coworkers last shift was 11/05/18. 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    Review of Systems   Patient did not express any new concerns or symptoms.  Patient Active Problem List   Diagnosis Date Noted  . GERD (gastroesophageal reflux disease) 11/01/2017  . HTN (hypertension) 08/30/2017   Allergies as of 11/06/2018      Reactions   Coconut Flavor    Eggs Or Egg-derived Products    Tomato Itching   Wheat Bran       Medication List       Accurate as of November 06, 2018 10:46 AM. Always use your most recent med list.        diazepam 2 MG tablet Commonly known as:  Valium Take 1 tablet (2 mg total) by mouth every 8 (eight) hours as needed for muscle spasms.   dicyclomine 20 MG tablet Commonly known as:  Bentyl Take 1 tablet (20 mg total) by mouth 3 (three) times daily as needed for spasms.   HYDROcodone-acetaminophen 5-325 MG tablet Commonly known as:  Norco Take 1 tablet by mouth every 6 (six) hours as needed for moderate pain.   ibuprofen 600 MG tablet Commonly known as:  ADVIL,MOTRIN Take 1 tablet (600 mg total) by mouth every 8 (eight) hours as needed.   losartan 50 MG tablet Commonly known as:  COZAAR Take 1 tablet (50 mg total) by mouth daily.   naproxen 500 MG tablet Commonly known as:  Naprosyn Take 1 tablet (500 mg total) by mouth 2 (two) times daily with a meal.   omeprazole 20 MG capsule Commonly known as:  PRILOSEC Take 1 capsule  (20 mg total) by mouth daily.   ondansetron 4 MG tablet Commonly known as:  Zofran Take 1 tablet (4 mg total) by mouth every 8 (eight) hours as needed for nausea or vomiting.   QUEtiapine 100 MG tablet Commonly known as:  SEROQUEL Take 1 tablet (100 mg total) by mouth at bedtime.   traZODone 100 MG tablet Commonly known as:  DESYREL Take 100 mg by mouth at bedtime.          Objective:   Physical Exam  Telephonic visit      Assessment & Plan:   Considering his work environment is is unlikely that he came into contact with COVID-19 at work. Could've picked up virus from outside contact. Reminded pt to stay safe. Patient asymptomatic at this time.  1. Essential hypertension  Refill for 30 Days:  - losartan (COZAAR) 50 MG tablet; Take 1 tablet (50 mg total) by mouth daily.  Dispense: 30 tablet; Refill: 0  2. Gastroesophageal reflux disease without esophagitis  Refill for 30 Days:  - omeprazole (PRILOSEC) 20 MG capsule; Take 1 capsule (20 mg total) by mouth daily.  Dispense: 30 capsule; Refill: 0   Patient has active insurance and will no longer be seen at [facility], no follow up. Pharmacy notified that we will no longer  refill his medications. Patient and his wife told to call new insurance to find a new PCP.

## 2018-11-25 ENCOUNTER — Emergency Department
Admission: EM | Admit: 2018-11-25 | Discharge: 2018-11-26 | Disposition: A | Discharge status: Home / Self Care | Payer: 59 | Attending: Emergency Medicine | Admitting: Emergency Medicine

## 2018-11-25 ENCOUNTER — Other Ambulatory Visit: Payer: Self-pay

## 2018-11-25 ENCOUNTER — Encounter: Payer: Self-pay | Admitting: Emergency Medicine

## 2018-11-25 DIAGNOSIS — I10 Essential (primary) hypertension: Secondary | ICD-10-CM | POA: Insufficient documentation

## 2018-11-25 DIAGNOSIS — R197 Diarrhea, unspecified: Secondary | ICD-10-CM | POA: Diagnosis not present

## 2018-11-25 DIAGNOSIS — Z79899 Other long term (current) drug therapy: Secondary | ICD-10-CM | POA: Insufficient documentation

## 2018-11-25 DIAGNOSIS — R109 Unspecified abdominal pain: Secondary | ICD-10-CM | POA: Diagnosis not present

## 2018-11-25 DIAGNOSIS — R195 Other fecal abnormalities: Secondary | ICD-10-CM | POA: Diagnosis not present

## 2018-11-25 NOTE — ED Notes (Signed)
Patient states having side pain on right side and has had two rounds of diarrhea with last episode around 2200. Patient states taking Pepto bismal around 2130 before work.

## 2018-11-25 NOTE — ED Triage Notes (Signed)
Patient ambulatory to triage with steady gait, without difficulty or distress noted; pt reports left work for pain to right side; denies abd or back pain; denies any accomp symptoms; st hx of same with kidney stone from taking too many antacids

## 2018-11-26 ENCOUNTER — Emergency Department: Payer: 59

## 2018-11-26 DIAGNOSIS — R197 Diarrhea, unspecified: Secondary | ICD-10-CM | POA: Diagnosis not present

## 2018-11-26 LAB — URINALYSIS, ROUTINE W REFLEX MICROSCOPIC
Bilirubin Urine: NEGATIVE
Glucose, UA: NEGATIVE mg/dL
Hgb urine dipstick: NEGATIVE
Ketones, ur: NEGATIVE mg/dL
Leukocytes,Ua: NEGATIVE
Nitrite: NEGATIVE
Protein, ur: NEGATIVE mg/dL
Specific Gravity, Urine: 1.028 (ref 1.005–1.030)
pH: 5 (ref 5.0–8.0)

## 2018-11-26 NOTE — Discharge Instructions (Signed)
Your workup in the Emergency Department today was reassuring.  We did not find any specific abnormalities.  We recommend you drink plenty of fluids, take your regular medications and/or any new ones prescribed today, and follow up with the doctor(s) listed in these documents as recommended.  Return to the Emergency Department if you develop new or worsening symptoms that concern you.  

## 2018-11-26 NOTE — ED Provider Notes (Signed)
[facility] Emergency Department Provider Note  ____________________________________________   First MD Initiated Contact with Patient 11/25/18 2314     (approximate)  I have reviewed the triage vital signs and the nursing notes.   HISTORY  Chief Complaint Abdominal Pain    HPI Jose May is a 53 y.o. male with medical history as listed below who presents for evaluation of 2 episodes of diarrhea and some pain in his right flank.   He says that his stomach felt a little bit upset earlier today but he has had no nausea or vomiting.  He has had 2 bowel movements that were "softer than usual".  He had a little bit of mild sharp pain in his right side and "I left work and came right here".  He says that his wife was concerned that he might have a kidney stone.  He has had no blood in his urine and no burning when he urinates.  He denies fever/chills, sore throat, nasal congestion, chest pain, shortness of breath, cough, nausea, vomiting, and abdominal pain.  Nothing in particular makes his symptoms better or worse.  He is able to walk around and does not appear to be in distress.  He has continued to work in spite of the COVID-19 pandemic but has not been around anyone who has had symptoms or tested positive for COVID-19.        Past Medical History:  Diagnosis Date   Anxiety    Bipolar 1 disorder (HCC)    Hypertension    PTSD (post-traumatic stress disorder)     Patient Active Problem List   Diagnosis Date Noted   GERD (gastroesophageal reflux disease) 11/01/2017   HTN (hypertension) 08/30/2017    History reviewed. No pertinent surgical history.  Prior to Admission medications   Medication Sig Start Date End Date Taking? Authorizing Provider  diazepam (VALIUM) 2 MG tablet Take 1 tablet (2 mg total) by mouth every 8 (eight) hours as needed for muscle spasms. 06/03/18   Irean HongSung, Jade J, MD  dicyclomine (BENTYL) 20 MG tablet Take 1 tablet  (20 mg total) by mouth 3 (three) times daily as needed for spasms. 03/11/18 03/11/19  Merrily Brittleifenbark, Neil, MD  HYDROcodone-acetaminophen (NORCO) 5-325 MG tablet Take 1 tablet by mouth every 6 (six) hours as needed for moderate pain. 06/03/18   Irean HongSung, Jade J, MD  ibuprofen (ADVIL,MOTRIN) 600 MG tablet Take 1 tablet (600 mg total) by mouth every 8 (eight) hours as needed. 03/11/18   Merrily Brittleifenbark, Neil, MD  losartan (COZAAR) 50 MG tablet Take 1 tablet (50 mg total) by mouth daily. 11/06/18   Virl Axehaplin, Don C, MD  naproxen (NAPROSYN) 500 MG tablet Take 1 tablet (500 mg total) by mouth 2 (two) times daily with a meal. 06/03/18   Irean HongSung, Jade J, MD  omeprazole (PRILOSEC) 20 MG capsule Take 1 capsule (20 mg total) by mouth daily. 11/06/18   Virl Axehaplin, Don C, MD  ondansetron (ZOFRAN) 4 MG tablet Take 1 tablet (4 mg total) by mouth every 8 (eight) hours as needed for nausea or vomiting. 03/25/17   Don PerkingVeronese, [place], MD  QUEtiapine (SEROQUEL) 100 MG tablet Take 1 tablet (100 mg total) by mouth at bedtime. 03/11/16 03/25/17  Triplett, Rulon Eisenmengerari B, FNP  traZODone (DESYREL) 100 MG tablet Take 100 mg by mouth at bedtime.    [provider]    Allergies Coconut flavor; Eggs or egg-derived products; Tomato; and Wheat bran  Family History  Problem Relation Age of Onset  Diabetes Mother    Hypertension Mother    Hypertension Father     Social History Social History   Tobacco Use   Smoking status: Never Smoker   Smokeless tobacco: Never Used  Substance Use Topics   Alcohol use: No   Drug use: No    Review of Systems Constitutional: No fever/chills Eyes: No visual changes. ENT: No sore throat. Cardiovascular: Denies chest pain. Respiratory: Denies shortness of breath. Gastrointestinal: Some pain in his right side, mostly the right flank.  2 episodes of loose stool.  No nausea nor vomiting nor anterior abdominal pain. Genitourinary: Negative for dysuria.  No hematuria. Musculoskeletal: Negative for neck pain.   Some mild right flank pain. Integumentary: Negative for rash. Neurological: Negative for headaches, focal weakness or numbness.   ____________________________________________   PHYSICAL EXAM:  VITAL SIGNS: ED Triage Vitals  Enc Vitals Group     BP 11/25/18 2309 106/71     Pulse Rate 11/25/18 2309 91     Resp 11/25/18 2309 18     Temp 11/25/18 2309 98 F (36.7 C)     Temp Source 11/25/18 2309 Oral     SpO2 11/25/18 2309 97 %     Weight 11/25/18 2308 80.7 kg (178 lb)     Height 11/25/18 2308 1.753 m ( )     Head Circumference --      Peak Flow --      Pain Score 11/25/18 2308 7     Pain Loc --      Pain Edu? --      Excl. in [place]? --     Constitutional: Alert and oriented. Well appearing and in no acute distress. Eyes: Conjunctivae are normal.  Head: Atraumatic. Nose: No congestion/rhinnorhea. Mouth/Throat: Mucous membranes are moist. Neck: No stridor.  No meningeal signs.   Cardiovascular: Normal rate, regular rhythm. Good peripheral circulation. Grossly normal heart sounds. Respiratory: Normal respiratory effort.  No retractions. No audible wheezing. Gastrointestinal: Soft and nontender. No distention.  Musculoskeletal: Questionable mild right CVA tenderness to percussion.  No lower extremity tenderness nor edema. No gross deformities of extremities.  Ambulatory without difficulty, no apparent distress. Neurologic:  Normal speech and language. No gross focal neurologic deficits are appreciated.  Skin:  Skin is warm, dry and intact. No rash noted. Psychiatric: Mood and affect are normal. Speech and behavior are normal.  ____________________________________________   LABS (all labs ordered are listed, but only abnormal results are displayed)  Labs Reviewed  URINALYSIS, ROUTINE W REFLEX MICROSCOPIC - Abnormal; Notable for the following components:      Result Value   Color, Urine YELLOW (*)    APPearance CLEAR (*)    All other components within normal limits    ____________________________________________  EKG  No indication for EKG ____________________________________________  RADIOLOGY   ED MD interpretation: No acute abnormalities identified on CT renal stone study  Official radiology report(s): Ct Renal Stone Study  Result Date: 11/26/2018 CLINICAL DATA:  Right-sided flank pain with diarrhea. EXAM: CT ABDOMEN AND PELVIS WITHOUT CONTRAST TECHNIQUE: Multidetector CT imaging of the abdomen and pelvis was performed following the standard protocol without IV contrast. COMPARISON:  03/11/2018 FINDINGS: LOWER CHEST: There is no basilar pleural or apical pericardial effusion. HEPATOBILIARY: The hepatic contours and density are normal. There is no intra- or extrahepatic biliary dilatation. The gallbladder is normal. PANCREAS: The pancreatic parenchymal contours are normal and there is no ductal dilatation. There is no peripancreatic fluid collection. SPLEEN: Normal. ADRENALS/URINARY TRACT: --Adrenal glands: Normal. --  Right kidney/ureter: No hydronephrosis, nephroureterolithiasis, perinephric stranding or solid renal mass. --Left kidney/ureter: No hydronephrosis, nephroureterolithiasis, perinephric stranding or solid renal mass. --Urinary bladder: Normal for degree of distention STOMACH/BOWEL: --Stomach/Duodenum: There is no hiatal hernia or other gastric abnormality. The duodenal course and caliber are normal. --Small bowel: No dilatation or inflammation. --Colon: No focal abnormality. --Appendix: Normal. VASCULAR/LYMPHATIC: Normal course and caliber of the major abdominal vessels. No abdominal or pelvic lymphadenopathy. REPRODUCTIVE: Normal prostate size with symmetric seminal vesicles. MUSCULOSKELETAL. No bony spinal canal stenosis or focal osseous abnormality. OTHER: None. IMPRESSION: No obstructive uropathy or nephrolithiasis. No acute abdominopelvic abnormality. Electronically Signed   By: Deatra Robinson M.D.   On: 11/26/2018 00:44     ____________________________________________   PROCEDURES   Procedure(s) performed (including Critical Care):  Procedures   ____________________________________________   INITIAL IMPRESSION / MDM / ASSESSMENT AND PLAN / ED COURSE  As part of my medical decision making, I reviewed the following data within the electronic MEDICAL RECORD NUMBER Nursing notes reviewed and incorporated, Old chart reviewed, Notes from prior ED visits and [place] Controlled Substance Database      Jose May was evaluated in Emergency Department on 11/26/2018 for the symptoms described in the history of present illness. He was evaluated in the context of the global COVID-19 pandemic, which necessitated consideration that the patient might be at risk for infection with the SARS-CoV-2 virus that causes COVID-19. Institutional protocols and algorithms that pertain to the evaluation of patients at risk for COVID-19 are in a state of rapid change based on information released by regulatory bodies including the CDC and federal and state organizations. These policies and algorithms were followed during the patient's care in the ED.  Differential diagnosis includes, but is not limited to, viral illness (though unlikely to be the result of novel coronavirus), foodborne illness, intra-abdominal infection, or UTI/pyelonephritis, renal colic.  The patient is well-appearing and in no distress.  He is ambulatory without difficulty and his vital signs are stable and within normal limits.  I explained I did not think that blood work would be useful.  Given that he is quite concerned about the possibility of a kidney stone I will obtain a CT of his abdomen and pelvis laterally to evaluate for possible ureteral stones but also to look for any acute abnormalities that could explain his 2 loose stools, but it does not sound as if he is truly having diarrhea.  He is in no distress and not having any pain.  He is in agreement  with the plan and knows that he needs to provide a urine specimen as well.  We will check the CT scan and a urinalysis and I anticipate discharge with outpatient follow-up information.   Clinical Course as of Nov 26 154  Tue Nov 26, 2018  0049 No acute abnormality identified on CT scan.  CT Renal Stone Study [CF]  0148 Normal UA, will d/c for outpatient follow up  Urinalysis, Routine w reflex microscopic(!) [CF]    Clinical Course User Index [CF] Loleta Rose, MD     ____________________________________________  FINAL CLINICAL IMPRESSION(S) / ED DIAGNOSES  Final diagnoses:  Loose stools  Right flank pain     MEDICATIONS GIVEN DURING THIS VISIT:  Medications - No data to display   ED Discharge Orders    None       Note:  This document was prepared using Dragon voice recognition software and may include unintentional dictation errors.   Loleta Rose, MD 11/26/18  0156 ° °

## 2019-07-16 ENCOUNTER — Other Ambulatory Visit: Payer: Self-pay

## 2019-07-16 ENCOUNTER — Encounter: Payer: Self-pay | Admitting: Emergency Medicine

## 2019-07-16 ENCOUNTER — Emergency Department
Admission: EM | Admit: 2019-07-16 | Discharge: 2019-07-16 | Disposition: A | Discharge status: Home / Self Care | Payer: 59 | Attending: Emergency Medicine | Admitting: Emergency Medicine

## 2019-07-16 DIAGNOSIS — I10 Essential (primary) hypertension: Secondary | ICD-10-CM | POA: Insufficient documentation

## 2019-07-16 DIAGNOSIS — Z79899 Other long term (current) drug therapy: Secondary | ICD-10-CM | POA: Insufficient documentation

## 2019-07-16 DIAGNOSIS — L739 Follicular disorder, unspecified: Secondary | ICD-10-CM | POA: Insufficient documentation

## 2019-07-16 MED ORDER — TRAMADOL HCL 50 MG PO TABS: 50.0000 mg | ORAL_TABLET | Freq: Four times a day (QID) | ORAL | 0 refills | Status: DC | PRN

## 2019-07-16 MED ORDER — SULFAMETHOXAZOLE-TRIMETHOPRIM 800-160 MG PO TABS: 1.0000 | ORAL_TABLET | Freq: Two times a day (BID) | ORAL | 0 refills | Status: DC

## 2019-07-16 NOTE — ED Notes (Signed)
AAOx3.  Skin warm and dry.  NAD 

## 2019-07-16 NOTE — ED Provider Notes (Signed)
[facility] Emergency Department Provider Note  ____________________________________________   First MD Initiated Contact with Patient 07/16/19 [id number]     (approximate)  I have reviewed the triage vital signs and the nursing notes.   HISTORY  Chief Complaint Abscess   HPI Jose May is a 53 y.o. male presents to the ED with complaint of 2 tender areas on his abdomen.  Patient states that they are less than dime size and are tender to touch.  He has not been using any over-the-counter medication or topical creams.  Patient denies any fever, chills, nausea or vomiting.  He states he has not had any thing like this.  He rates his pain as 5/10.     Past Medical History:  Diagnosis Date  . Anxiety   . Bipolar 1 disorder (HCC)   . Hypertension   . PTSD (post-traumatic stress disorder)     Patient Active Problem List   Diagnosis Date Noted  . GERD (gastroesophageal reflux disease) 11/01/2017  . HTN (hypertension) 08/30/2017    History reviewed. No pertinent surgical history.  Prior to Admission medications   Medication Sig Start Date End Date Taking? Authorizing Provider  diazepam (VALIUM) 2 MG tablet Take 1 tablet (2 mg total) by mouth every 8 (eight) hours as needed for muscle spasms. 06/03/18   Irean Hong, MD  dicyclomine (BENTYL) 20 MG tablet Take 1 tablet (20 mg total) by mouth 3 (three) times daily as needed for spasms. 03/11/18 03/11/19  Merrily Brittle, MD  HYDROcodone-acetaminophen (NORCO) 5-325 MG tablet Take 1 tablet by mouth every 6 (six) hours as needed for moderate pain. 06/03/18   Irean Hong, MD  ibuprofen (ADVIL,MOTRIN) 600 MG tablet Take 1 tablet (600 mg total) by mouth every 8 (eight) hours as needed. 03/11/18   Merrily Brittle, MD  losartan (COZAAR) 50 MG tablet Take 1 tablet (50 mg total) by mouth daily. 11/06/18   Virl Axe, MD  naproxen (NAPROSYN) 500 MG tablet Take 1 tablet (500 mg total) by mouth 2 (two) times daily  with a meal. 06/03/18   Irean Hong, MD  omeprazole (PRILOSEC) 20 MG capsule Take 1 capsule (20 mg total) by mouth daily. 11/06/18   Virl Axe, MD  ondansetron (ZOFRAN) 4 MG tablet Take 1 tablet (4 mg total) by mouth every 8 (eight) hours as needed for nausea or vomiting. 03/25/17   Don Perking, [place], MD  QUEtiapine (SEROQUEL) 100 MG tablet Take 1 tablet (100 mg total) by mouth at bedtime. 03/11/16 03/25/17  Triplett, Rulon Eisenmenger B, FNP  sulfamethoxazole-trimethoprim (BACTRIM DS) 800-160 MG tablet Take 1 tablet by mouth 2 (two) times daily. 07/16/19   Tommi Rumps, PA-C  traMADol (ULTRAM) 50 MG tablet Take 1 tablet (50 mg total) by mouth every 6 (six) hours as needed for severe pain. 07/16/19   Tommi Rumps, PA-C  traZODone (DESYREL) 100 MG tablet Take 100 mg by mouth at bedtime.    [provider]    Allergies Coconut flavor, Eggs or egg-derived products, Tomato, and Wheat bran  Family History  Problem Relation Age of Onset  . Diabetes Mother   . Hypertension Mother   . Hypertension Father     Social History Social History   Tobacco Use  . Smoking status: Never Smoker  . Smokeless tobacco: Never Used  Substance Use Topics  . Alcohol use: No  . Drug use: No    Review of Systems Constitutional: No fever/chills Cardiovascular: Denies chest  pain. Respiratory: Denies shortness of breath. Gastrointestinal: No abdominal pain.  No nausea, no vomiting.  Musculoskeletal: Negative for back pain. Skin: Positive for possible abscess. Neurological: Negative for headaches, focal weakness or numbness. ____________________________________________   PHYSICAL EXAM:  VITAL SIGNS: ED Triage Vitals  Enc Vitals Group     BP 07/16/19 0818 (!) 143/84     Pulse Rate 07/16/19 0818 89     Resp 07/16/19 0818 20     Temp 07/16/19 0818 98.9 F (37.2 C)     Temp Source 07/16/19 0818 Oral     SpO2 07/16/19 0818 99 %     Weight 07/16/19 0814 190 lb (86.2 kg)     Height 07/16/19 0814  5\' 9"  (1.753 m)     Head Circumference --      Peak Flow --      Pain Score 07/16/19 0814 5     Pain Loc --      Pain Edu? --      Excl. in [place]? --     Constitutional: Alert and oriented. Well appearing and in no acute distress. Eyes: Conjunctivae are normal.  Head: Atraumatic. Neck: No stridor.   Cardiovascular: Normal rate, regular rhythm. Grossly normal heart sounds.  Good peripheral circulation. Respiratory: Normal respiratory effort.  No retractions. Lungs CTAB. Musculoskeletal: Moves upper and lower extremities any difficulty.  Normal gait was noted. Neurologic:  Normal speech and language. No gross focal neurologic deficits are appreciated. No gait instability. Skin:  Skin is warm, dry and intact.  There are 2 individual papules with minimal erythema on the abdomen near the waistline.  Areas are firm and tender.  No erythema was noted.  No evidence of draining.  Questionable early abscess versus 2 individual infected hairs. Psychiatric: Mood and affect are normal. Speech and behavior are normal.  ____________________________________________   LABS (all labs ordered are listed, but only abnormal results are displayed)  Labs Reviewed - No data to display   PROCEDURES  Procedure(s) performed (including Critical Care):  Procedures   ____________________________________________   INITIAL IMPRESSION / ASSESSMENT AND PLAN / ED COURSE  As part of my medical decision making, I reviewed the following data within the electronic MEDICAL RECORD NUMBER Notes from prior ED visits and [place] Controlled Substance Database  53 year old male presents to the ED with 2 raised areas on his abdomen that have been there for approximately 2 days.  Patient states they are very tender.  They both are near his belt buckle which of course is aggravating it.  Area is firm without erythema.  Patient is instructed to begin using warm compresses to the area frequently.  He was also given a prescription for  Bactrim DS twice daily for 10 days and tramadol if needed for pain.  He will return to the emergency department if any increased area in size or need for I&D.  ____________________________________________   FINAL CLINICAL IMPRESSION(S) / ED DIAGNOSES  Final diagnoses:  Folliculitis     ED Discharge Orders         Ordered    sulfamethoxazole-trimethoprim (BACTRIM DS) 800-160 MG tablet  2 times daily     07/16/19 0905    traMADol (ULTRAM) 50 MG tablet  Every 6 hours PRN     07/16/19 0905           Note:  This document was prepared using Dragon voice recognition software and may include unintentional dictation errors.    Johnn Hai, PA-C 07/16/19 1057  Sharman CheekStafford, Phillip, MD 07/16/19 1436

## 2019-07-16 NOTE — ED Notes (Signed)
C/O pain to two small areas with round, slightly raised areas.  One dime size area noted to right lower abdomen just below umbilicus and a second area, nickle size, noted to left suprapubic area.  No drainage seen.  Patient c/o pain to areas when pressure from belt or waist band is applied.  Patient is AAOx3.  Skin warm and dry. NAD

## 2019-07-16 NOTE — Discharge Instructions (Signed)
Follow-up with your primary care provider or return to the emergency department if any severe worsening of your symptoms or if area becomes large and soft so that we can open it up and drain it.  Begin using warm compresses to the area frequently 5-6 times a day if possible.  Begin taking antibiotics until completely finished.  The pain medication is for moderate to severe pain.  Do not take this medication and drive or operate machinery.

## 2019-07-16 NOTE — ED Notes (Signed)
Patient denies pain and is resting comfortably.  

## 2019-07-16 NOTE — ED Triage Notes (Signed)
Pt reports abscess to his left abd for 2 days. Pt reports it has not been draining but it hurts.

## 2019-11-14 ENCOUNTER — Other Ambulatory Visit: Payer: Self-pay | Admitting: Internal Medicine

## 2019-11-14 DIAGNOSIS — I10 Essential (primary) hypertension: Secondary | ICD-10-CM

## 2020-03-24 IMAGING — CT CT ABD-PELV W/O CM
2 of 4 series · 16 of 46 positions shown, 18 images · non-contrast
Comparison: None.

CLINICAL DATA: Intermittent upper abdominal pain more so in the
right upper quadrant and right flank times 2-3 days. Nausea.

EXAM:
CT ABDOMEN AND PELVIS WITHOUT CONTRAST
TECHNIQUE: Multidetector CT imaging of the abdomen and pelvis was performed
following the standard protocol without IV contrast.

[Series 2: routine abd/pel wo · axial · 0.65mm/px · z∈[-870,-465]mm · 13 of 89 slices shown, 15 images]
[im 4/89  soft-tissue]
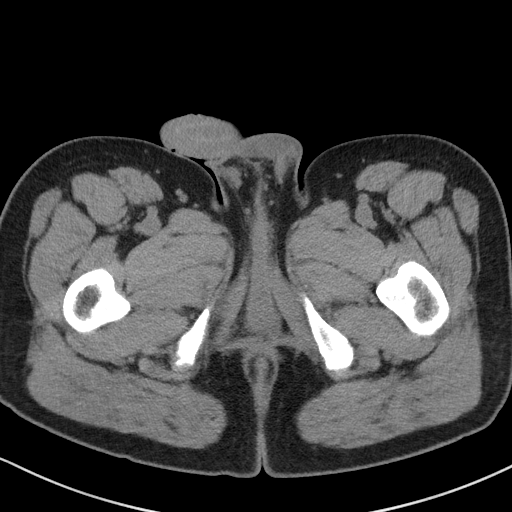
[im 4/89  bone]
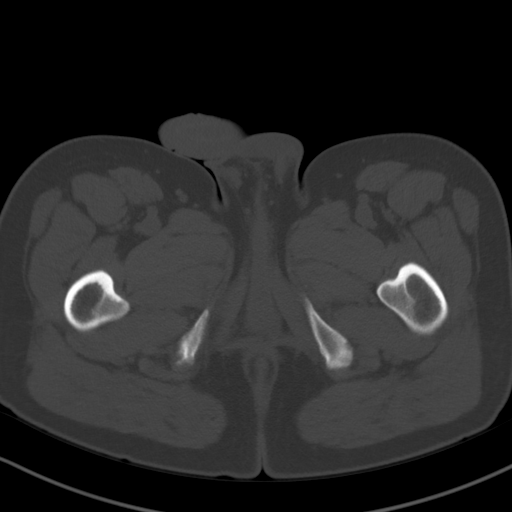
[im 12/89  soft-tissue]
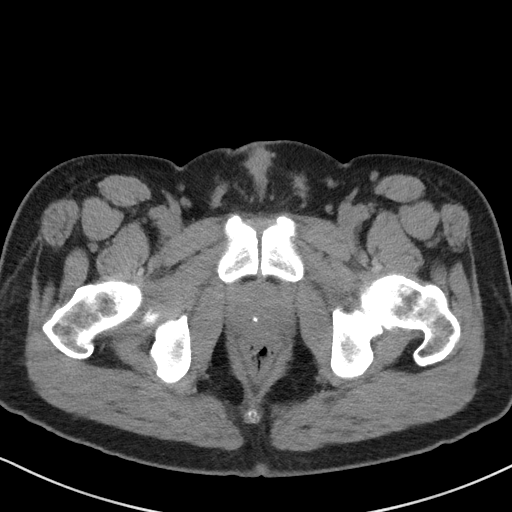
[im 19/89  soft-tissue]
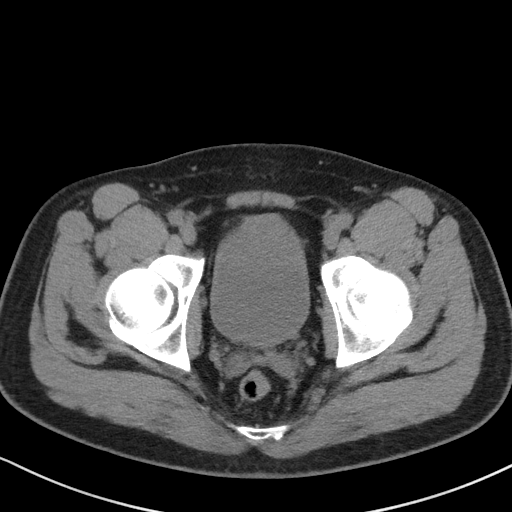
[im 26/89  soft-tissue]
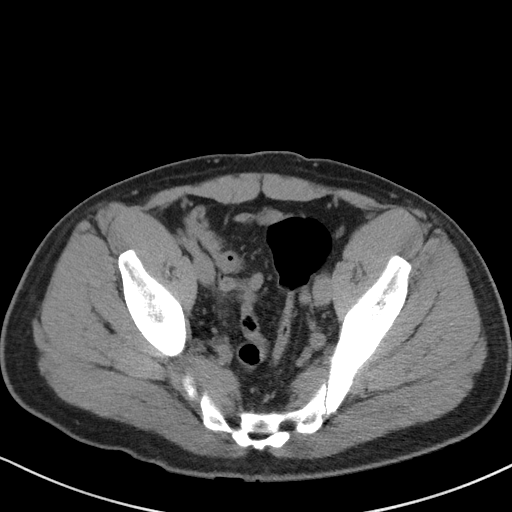
[im 30/89  soft-tissue]
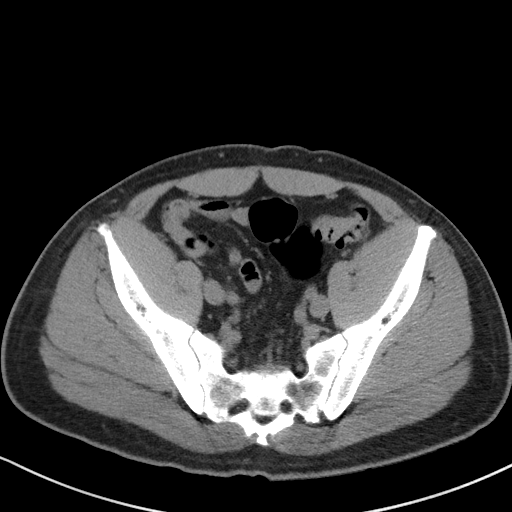
[im 37/89  soft-tissue]
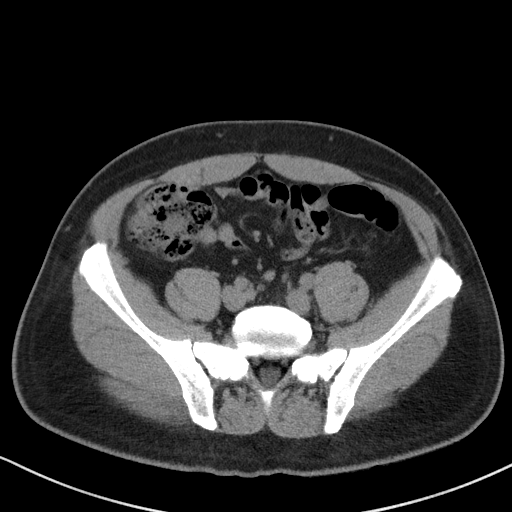
[im 45/89  soft-tissue]
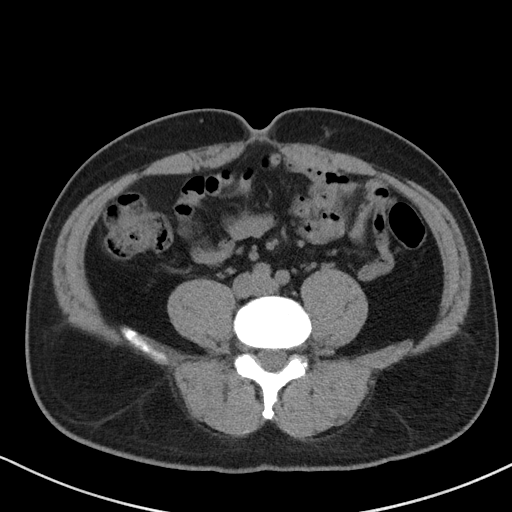
[im 52/89  soft-tissue]
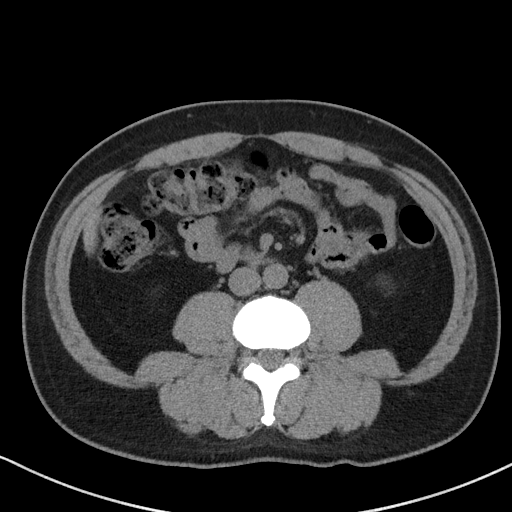
[im 59/89  soft-tissue]
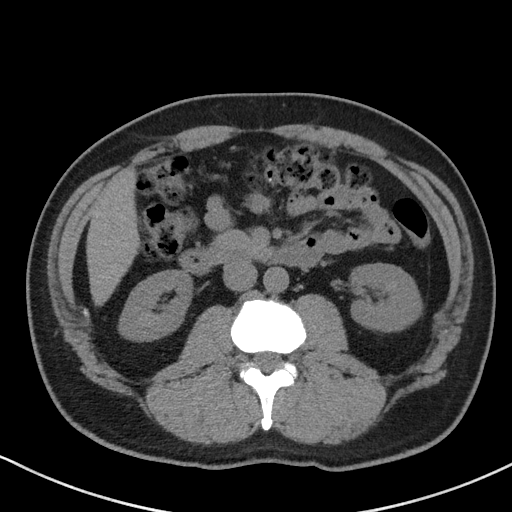
[im 59/89  bone]
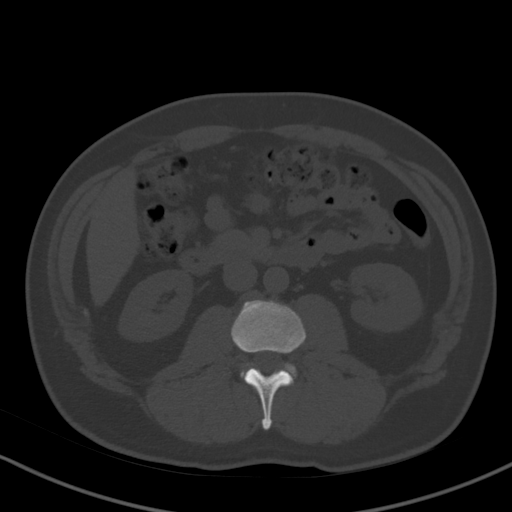
[im 63/89  soft-tissue]
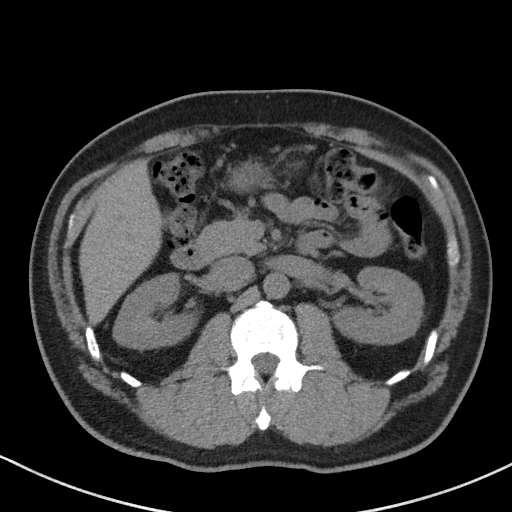
[im 70/89  soft-tissue]
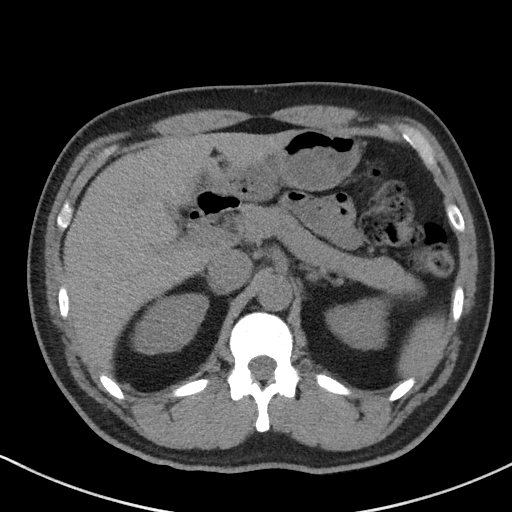
[im 78/89  soft-tissue]
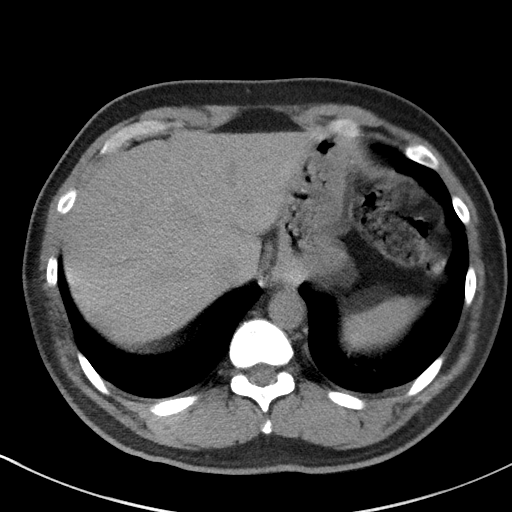
[im 85/89  soft-tissue]
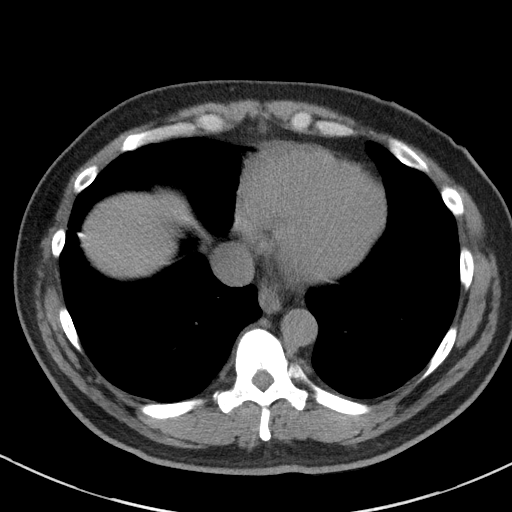

[Series 6: coronal st · coronal · 0.61mm/px · 3 of 82 slices shown]
[im 28/82  soft-tissue]
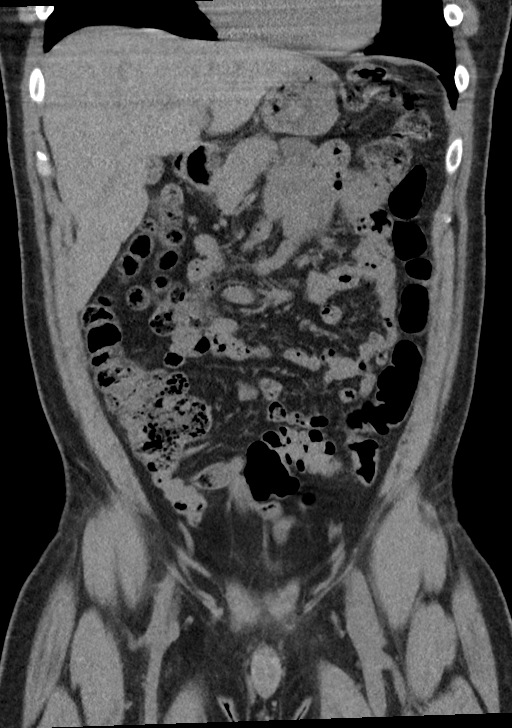
[im 37/82  soft-tissue]
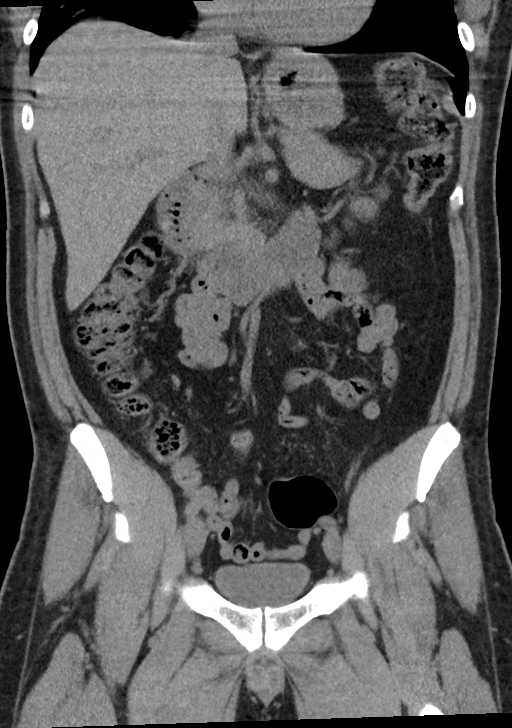
[im 46/82  soft-tissue]
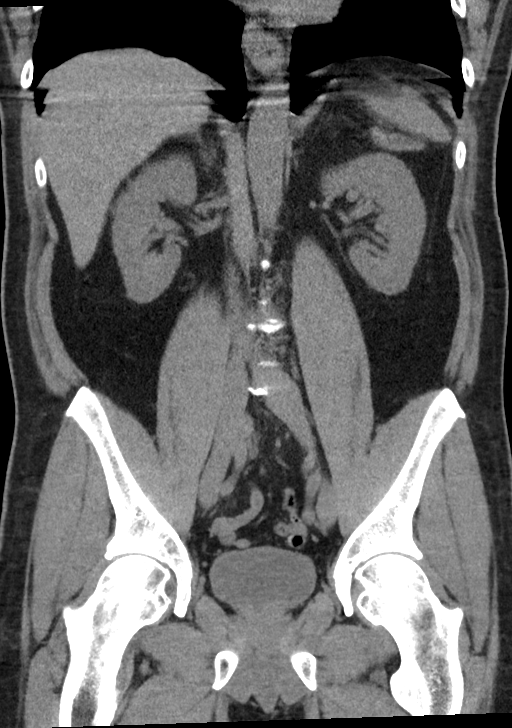

[16 of 46 positions shown; findings below may reference images not displayed]

FINDINGS: Lower chest: No acute abnormality. Right basilar subsegmental
atelectasis.

Hepatobiliary: Given limitations of a noncontrast study, the
unenhanced liver is unremarkable. Decompressed gallbladder without
stones. No biliary dilatation is identified.

Pancreas: Normal

Spleen: Normal

Adrenals/Urinary Tract: Normal bilateral adrenal glands and kidneys
without obstructive uropathy, mass nor inflammation. The urinary
bladder is unremarkable for the degree of distention.

Stomach/Bowel: Small to moderate-sized hiatal hernia. Decompressed
stomach with normal small bowel rotation. No bowel obstruction.
Distal and terminal ileum as well as appendix are normal. Increased
colonic stool burden is noted from cecum through distal transverse
colon. No bowel thickening or inflammation is identified.

Vascular/Lymphatic: Nonaneurysmal aorta.  No lymphadenopathy.

Reproductive: Prostate and seminal vesicles are unremarkable.

Other: No abdominal wall hernia or abnormality. No abdominopelvic
ascites.

Musculoskeletal: No acute or significant osseous findings.
IMPRESSION: 1. Small to moderate-sized hiatal hernia.
2. No bowel obstruction nor inflammation. Increased colonic stool
burden is noted.

## 2020-03-24 IMAGING — US US ABDOMEN LIMITED
1 series · 14 of 25 positions shown · non-contrast
Comparison: 03/25/2017 CT abdomen and pelvis.

CLINICAL DATA: 52 y/o M; 2-3 days of right upper quadrant abdominal
pain and nausea.

EXAM:
ULTRASOUND ABDOMEN LIMITED RIGHT UPPER QUADRANT

[Series 1: us abdomen limited · 14 of 52 slices shown]
[im 1/52]
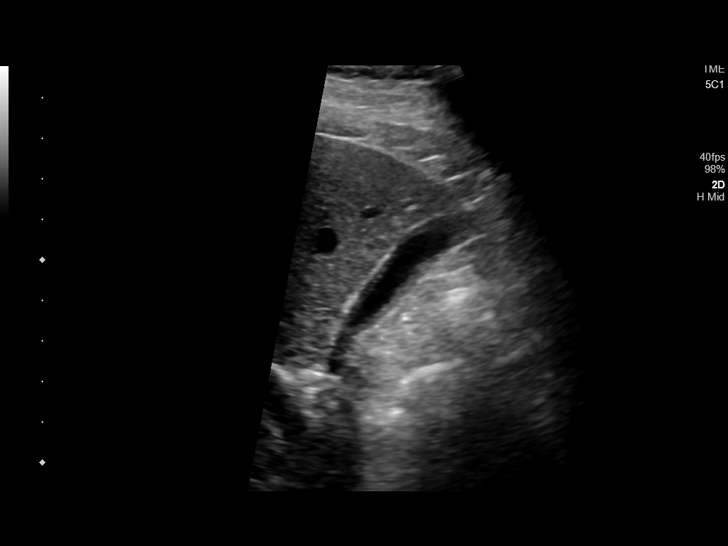
[im 5/52]
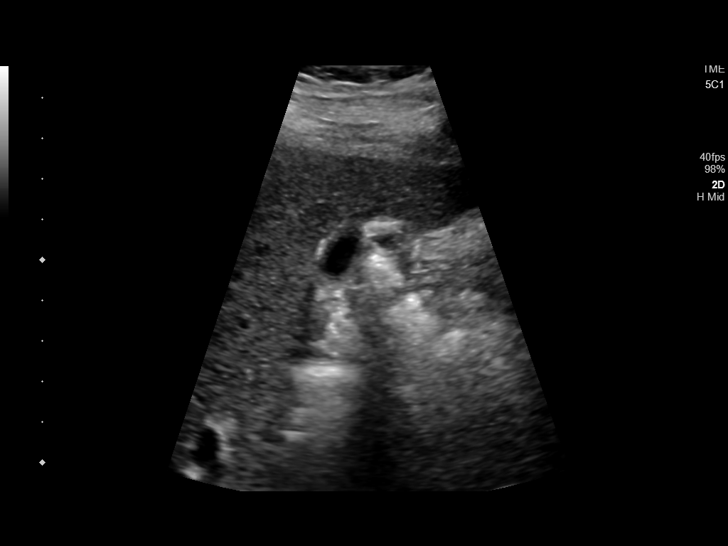
[im 9/52]
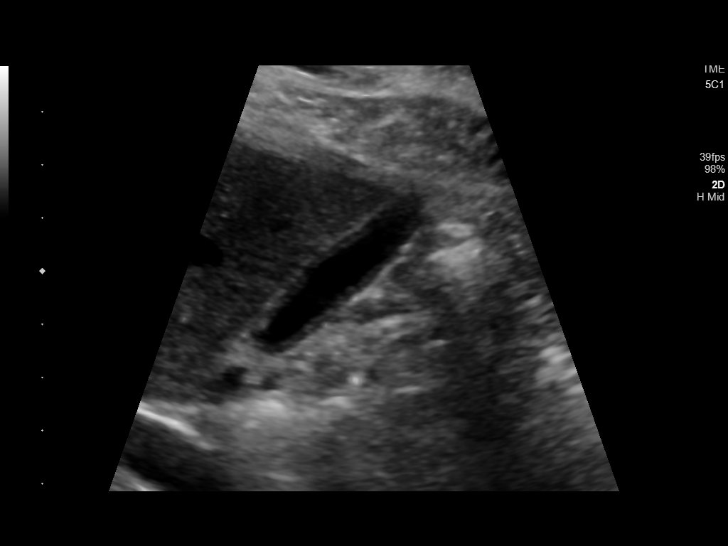
[im 13/52]
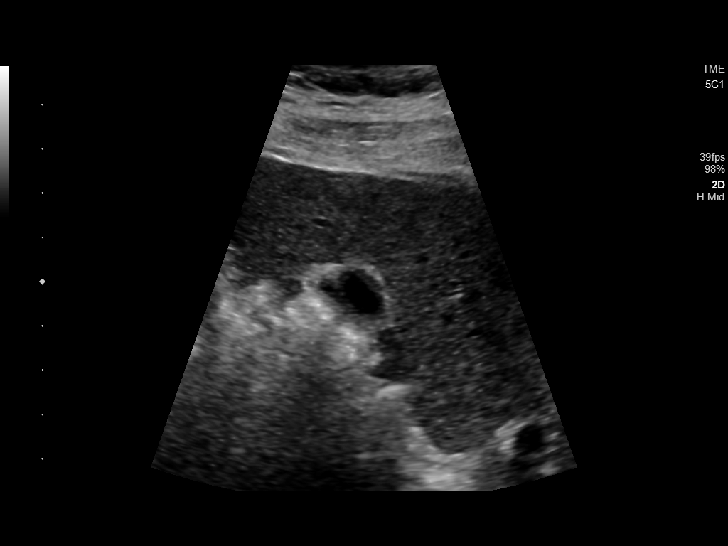
[im 18/52]
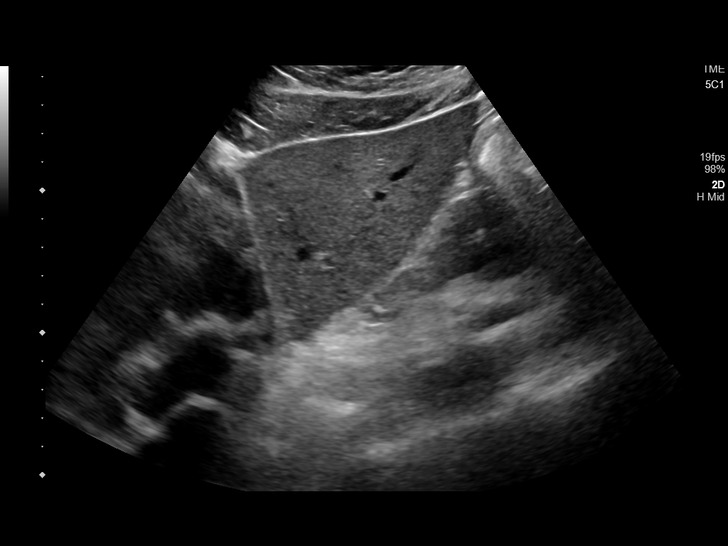
[im 20/52]
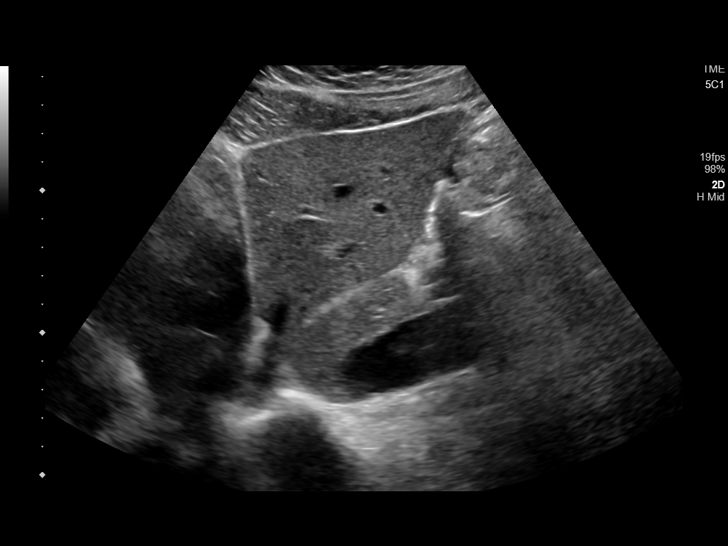
[im 24/52]
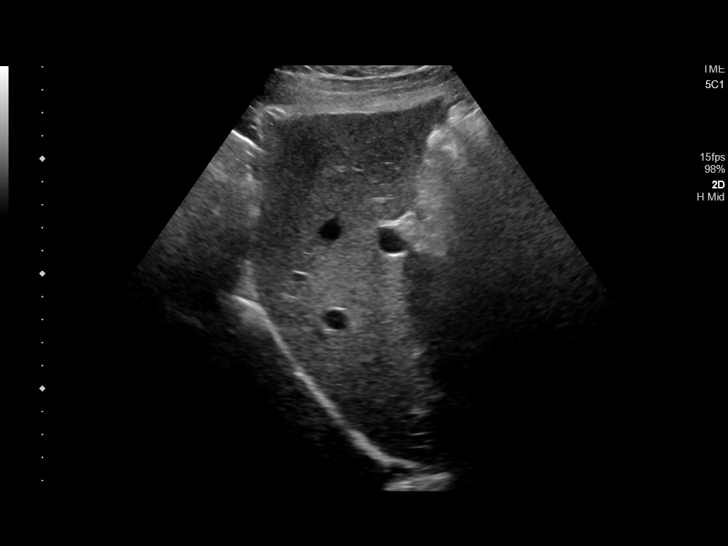
[im 28/52]
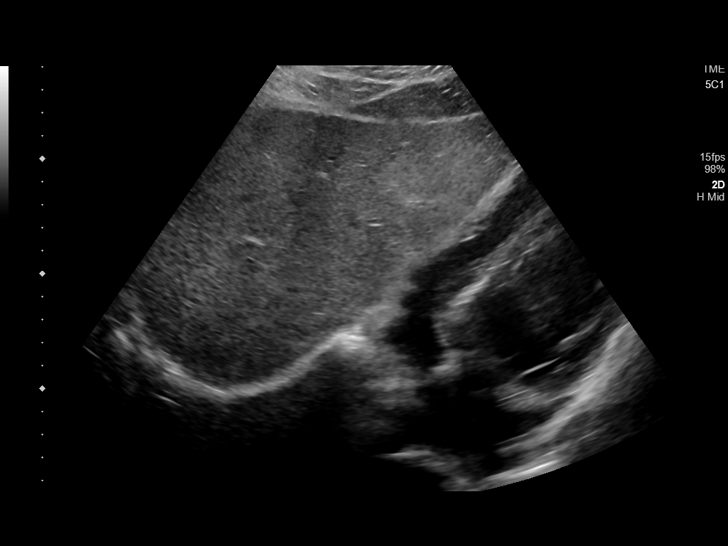
[im 32/52]
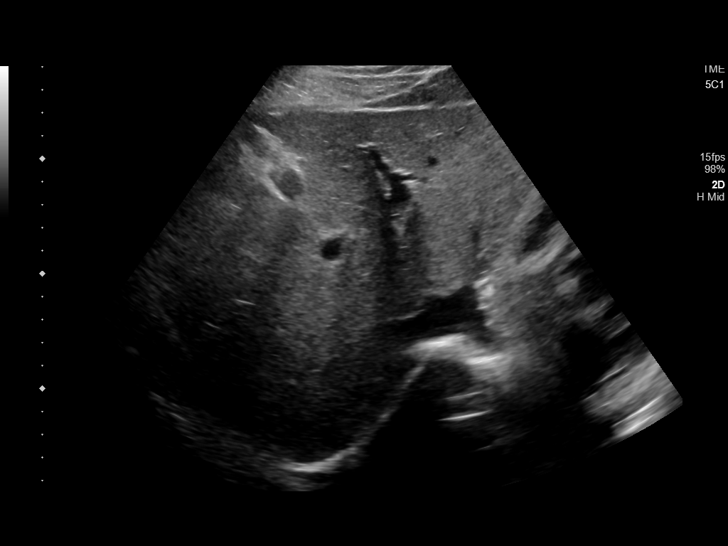
[im 35/52]
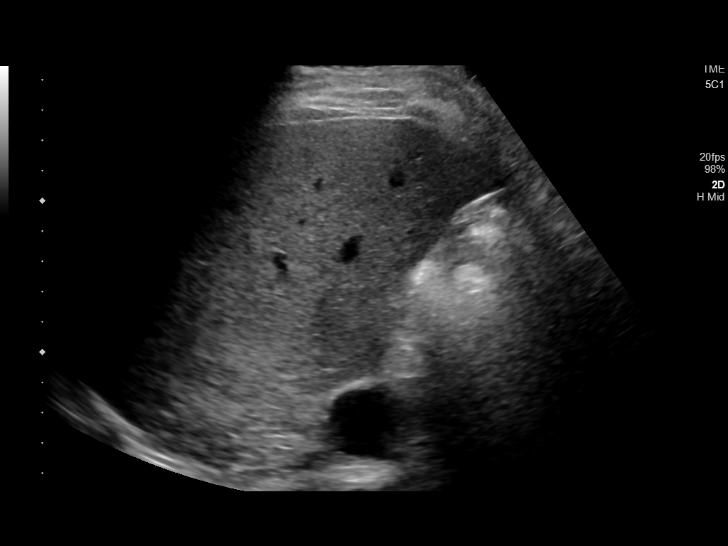
[im 39/52]
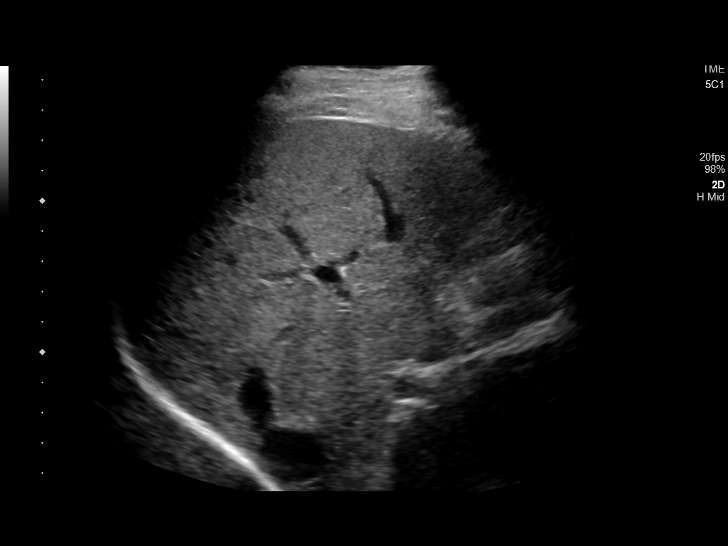
[im 43/52]
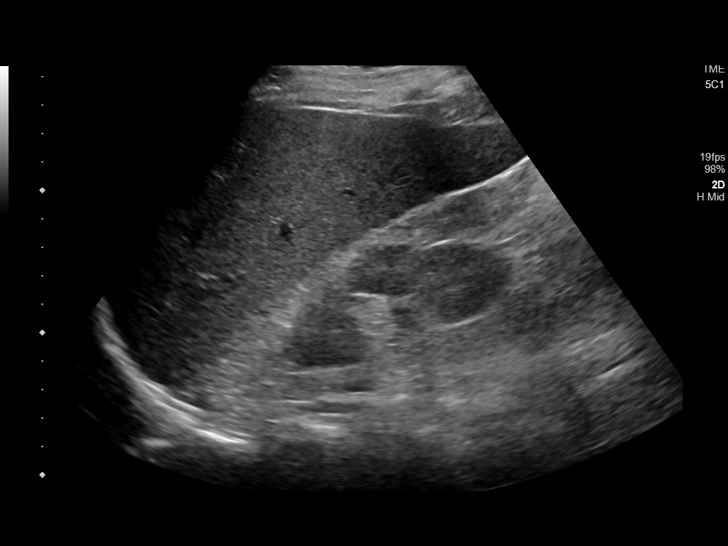
[im 47/52]
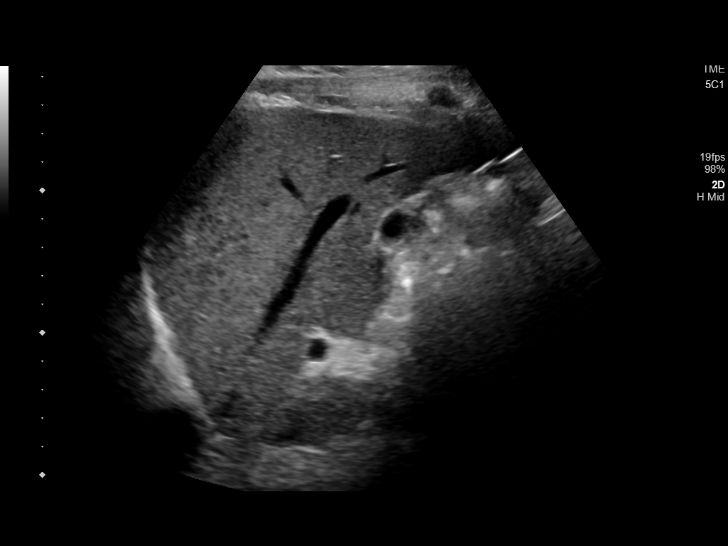
[im 52/52]
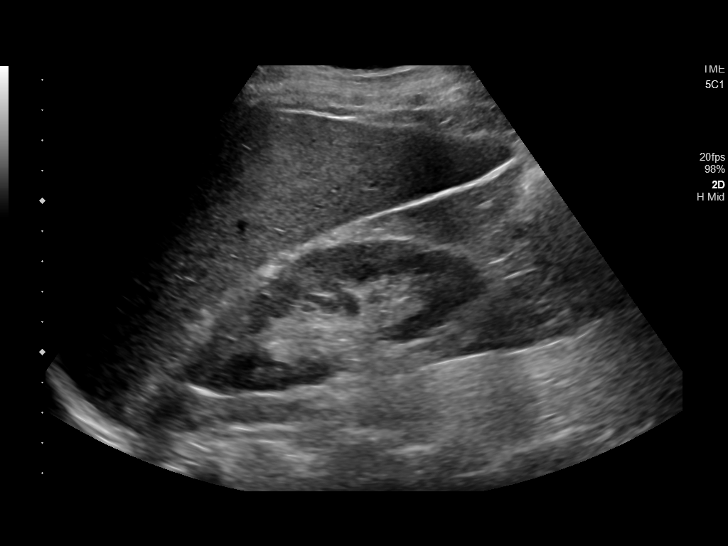

[14 of 25 positions shown; findings below may reference images not displayed]

FINDINGS: Gallbladder:

No gallstones or wall thickening visualized. No sonographic Murphy
sign noted by sonographer.

Common bile duct:

Diameter: 3.4 mm

Liver:

No focal lesion identified. Within normal limits in parenchymal
echogenicity. Portal vein is patent on color Doppler imaging with
normal direction of blood flow towards the liver.
IMPRESSION: Normal right upper quadrant ultrasound.

By: Kruti Gorostieta M.D.

## 2020-05-10 ENCOUNTER — Other Ambulatory Visit: Payer: Self-pay | Admitting: Emergency Medicine

## 2020-05-21 ENCOUNTER — Other Ambulatory Visit: Payer: Self-pay | Admitting: Student

## 2020-06-16 ENCOUNTER — Other Ambulatory Visit: Payer: Self-pay | Admitting: Family Medicine

## 2020-07-19 ENCOUNTER — Other Ambulatory Visit: Payer: Self-pay

## 2020-07-19 ENCOUNTER — Ambulatory Visit: Payer: Medicaid Other | Admitting: Pharmacy Technician

## 2020-07-19 DIAGNOSIS — Z79899 Other long term (current) drug therapy: Secondary | ICD-10-CM

## 2020-07-19 NOTE — Progress Notes (Signed)
Completed [facility] application and contract.  Patient agreed to all terms of the [facility] contract.    Patient approved to receive medication assistance at [facility] until time for re-certification in 2022, and as long as eligibility criteria continues to be met.    Provided patient with community resource material based on his particular needs.     J.  Care Manager [facility]  

## 2020-08-12 ENCOUNTER — Ambulatory Visit: Payer: Medicaid Other | Admitting: Family Medicine

## 2020-08-12 ENCOUNTER — Other Ambulatory Visit: Payer: Self-pay

## 2020-08-12 ENCOUNTER — Other Ambulatory Visit: Payer: Self-pay | Admitting: Family Medicine

## 2020-08-12 VITALS — BP 122/84 | HR 95 | Ht 69.0 in | Wt 193.3 lb

## 2020-08-12 DIAGNOSIS — I10 Essential (primary) hypertension: Secondary | ICD-10-CM

## 2020-08-12 DIAGNOSIS — R635 Abnormal weight gain: Secondary | ICD-10-CM

## 2020-08-12 DIAGNOSIS — K219 Gastro-esophageal reflux disease without esophagitis: Secondary | ICD-10-CM

## 2020-08-12 MED ORDER — SUCRALFATE 1 GM/10ML PO SUSP: ORAL | 0 refills | Status: AC

## 2020-08-12 MED ORDER — FLUTICASONE PROPIONATE 50 MCG/ACT NA SUSP: 2.0000 | Freq: Every evening | NASAL | 1 refills | Status: DC

## 2020-08-12 NOTE — Patient Instructions (Addendum)
Use Flonase at night for congestion. Try to not eat for at least 3-4 hours before going to bed. Try using Sucralfate in the evening.  Increase your physical activity.

## 2020-08-12 NOTE — Progress Notes (Signed)
Established Patient Office Visit  Subjective:  Patient ID: Jose May, male    DOB: [birth year]  Age: 55 y.o. MRN: [id number]  CC:  Chief Complaint  Patient presents with  . Gastroesophageal Reflux  . Insomnia  . Breathing Problem    Trouble breathing at night    HPI Jose May presents for recheck.  He has a hx of GERD.  Has been taking Prilosec nightly.  In the past 2-3 months, he wakes in the middle of the night has a "full" bloated feeling in his stomach.  No reflux or heartburn.  Also feels nasal congestion at night, with difficulty breathing through his nose.  He has tried [facility], which helps temporarily.  He has gained about 20 lb over the past 6 months or so; has been less physically active and is eating differently.  He lost his job about 3 months ago, working night shift.  He admits eating shortly before going to bed, which used to be his lunchtime.  BM's are a little les frequent.  He had a CT at [facility] in Oct, 2021 which was normal other than mural thickening of the cecum.  He sees Psychiatry; is on Trazodone and Seriquel nightly, which he has been on for years.  Doing fairly well psychologically, but possibly more anxiety since losing his job.  Past Medical History:  Diagnosis Date  . Anxiety   . Bipolar 1 disorder ([place])   . Hypertension   . PTSD (post-traumatic stress disorder)     No past surgical history on file.  Family History  Problem Relation Age of Onset  . Diabetes Mother   . Hypertension Mother   . Hypertension Father     Social History   Socioeconomic History  . Marital status: Married    Spouse name: Chrisangel Eskenazi   . Number of children: 2  . Years of education: some college   . Highest education level: Not on file  Occupational History  . Not on file  Tobacco Use  . Smoking status: Never Smoker  . Smokeless tobacco: Never Used  Vaping Use  . Vaping Use: Never used  Substance and Sexual Activity  . Alcohol use: No   . Drug use: No  . Sexual activity: Yes    Partners: Female    Birth control/protection: None  Other Topics Concern  . Not on file  Social History Narrative   PT works at [employer].    Social Determinants of Health   Financial Resource Strain: Not on file  Food Insecurity: Not on file  Transportation Needs: Not on file  Physical Activity: Not on file  Stress: Not on file  Social Connections: Not on file  Intimate Partner Violence: Not on file    Outpatient Medications Prior to Visit  Medication Sig Dispense Refill  . diazepam (VALIUM) 2 MG tablet Take 1 tablet (2 mg total) by mouth every 8 (eight) hours as needed for muscle spasms. 15 tablet 0  . dicyclomine (BENTYL) 20 MG tablet Take 1 tablet (20 mg total) by mouth 3 (three) times daily as needed for spasms. 30 tablet 0  . HYDROcodone-acetaminophen (NORCO) 5-325 MG tablet Take 1 tablet by mouth every 6 (six) hours as needed for moderate pain. 15 tablet 0  . ibuprofen (ADVIL,MOTRIN) 600 MG tablet Take 1 tablet (600 mg total) by mouth every 8 (eight) hours as needed. 30 tablet 0  . losartan (COZAAR) 50 MG tablet Take 1 tablet (50 mg total)  by mouth daily. 30 tablet 0  . naproxen (NAPROSYN) 500 MG tablet Take 1 tablet (500 mg total) by mouth 2 (two) times daily with a meal. 14 tablet 0  . omeprazole (PRILOSEC) 20 MG capsule Take 1 capsule (20 mg total) by mouth daily. 30 capsule 0  . ondansetron (ZOFRAN) 4 MG tablet Take 1 tablet (4 mg total) by mouth every 8 (eight) hours as needed for nausea or vomiting. 20 tablet 0  . QUEtiapine (SEROQUEL) 100 MG tablet Take 1 tablet (100 mg total) by mouth at bedtime. 3 tablet 0  . sulfamethoxazole-trimethoprim (BACTRIM DS) 800-160 MG tablet Take 1 tablet by mouth 2 (two) times daily. 20 tablet 0  . traMADol (ULTRAM) 50 MG tablet Take 1 tablet (50 mg total) by mouth every 6 (six) hours as needed for severe pain. 12 tablet 0  . traZODone (DESYREL) 100 MG tablet Take 100 mg by mouth at  bedtime.     No facility-administered medications prior to visit.    Allergies  Allergen Reactions  . Coconut Flavor   . Eggs Or Egg-Derived Products   . Tomato Itching  . Wheat Bran     ROS Review of Systems    Objective:    Physical Exam  BP 122/84   Pulse 95   Ht 5\' 9"  (1.753 m)   Wt 193 lb 4.8 oz (87.7 kg)   SpO2 95%   BMI 28.55 kg/m  Wt Readings from Last 3 Encounters:  08/12/20 193 lb 4.8 oz (87.7 kg)  07/16/19 190 lb (86.2 kg)  11/25/18 178 lb (80.7 kg)     Health Maintenance Due  Topic Date Due  . Hepatitis C Screening  Never done  . HIV Screening  Never done  . TETANUS/TDAP  Never done  . COLONOSCOPY (Pts 45-28yrs Insurance coverage will need to be confirmed)  Never done  . INFLUENZA VACCINE  Never done    There are no preventive care reminders to display for this patient.  Lab Results  Component Value Date   TSH 2.490 10/25/2017   Lab Results  Component Value Date   WBC 8.5 03/10/2018   HGB 14.4 03/10/2018   HCT 42.9 03/10/2018   MCV 79.8 (L) 03/10/2018   PLT 325 03/10/2018   Lab Results  Component Value Date   NA 139 03/10/2018   K 4.0 03/10/2018   CO2 30 03/10/2018   GLUCOSE 110 (H) 03/10/2018   BUN 12 03/10/2018   CREATININE 1.15 03/10/2018   BILITOT 0.5 03/10/2018   ALKPHOS 85 03/10/2018   AST 17 03/10/2018   ALT 11 03/10/2018   PROT 7.9 03/10/2018   ALBUMIN 4.0 03/10/2018   CALCIUM 8.9 03/10/2018   ANIONGAP 3 (L) 03/10/2018   Lab Results  Component Value Date   CHOL 117 10/25/2017   Lab Results  Component Value Date   HDL 49 10/25/2017   Lab Results  Component Value Date   LDLCALC 44 10/25/2017   Lab Results  Component Value Date   TRIG 121 10/25/2017   Lab Results  Component Value Date   CHOLHDL 2.4 10/25/2017   No results found for: HGBA1C    Assessment & Plan:   Problem List Items Addressed This Visit   None   GI issues with h/o GERD.  Advised him to not eat for at least 3-4 hr before going to  bed.  Stay on Omeprazole nightly.  Add Sucralfate 10-20 ml nightly.  If symptoms persist, consider GI referral.  Nasal congestion.  Can use Flonase nightly if needed.  HTN.  Good control without any medication.  Stay on Seroquel and Trazodone nightly.  RTC in 4 months for recheck.  Consider repeat CT to follow up on mural thickening of the cecum if he will not go for colonoscopy.     No orders of the defined types were placed in this encounter.   Follow-up: No follow-ups on file.    Dorothey Baseman, MD

## 2020-08-13 LAB — COMPREHENSIVE METABOLIC PANEL
ALT: 13 IU/L (ref 0–44)
AST: 11 IU/L (ref 0–40)
Albumin/Globulin Ratio: 1.2 (ref 1.2–2.2)
Albumin: 4.4 g/dL (ref 3.8–4.9)
Alkaline Phosphatase: 111 IU/L (ref 44–121)
BUN/Creatinine Ratio: 11 (ref 9–20)
BUN: 12 mg/dL (ref 6–24)
Bilirubin Total: 0.4 mg/dL (ref 0.0–1.2)
CO2: 23 mmol/L (ref 20–29)
Calcium: 9.4 mg/dL (ref 8.7–10.2)
Chloride: 101 mmol/L (ref 96–106)
Creatinine, Ser: 1.08 mg/dL (ref 0.76–1.27)
GFR calc Af Amer: 89 mL/min/{1.73_m2} (ref >59)
GFR calc non Af Amer: 77 mL/min/{1.73_m2} (ref >59)
Globulin, Total: 3.6 g/dL (ref 1.5–4.5)
Glucose: 87 mg/dL (ref 65–99)
Potassium: 4.2 mmol/L (ref 3.5–5.2)
Sodium: 141 mmol/L (ref 134–144)
Total Protein: 8 g/dL (ref 6.0–8.5)

## 2020-08-13 LAB — CBC WITH DIFFERENTIAL/PLATELET
Basophils Absolute: 0 10*3/uL (ref 0.0–0.2)
Basos: 0 %
EOS (ABSOLUTE): 0.2 10*3/uL (ref 0.0–0.4)
Eos: 2 %
Hematocrit: 44.9 % (ref 37.5–51.0)
Hemoglobin: 14.7 g/dL (ref 13.0–17.7)
Immature Grans (Abs): 0 10*3/uL (ref 0.0–0.1)
Immature Granulocytes: 0 %
Lymphocytes Absolute: 3.2 10*3/uL — ABNORMAL HIGH (ref 0.7–3.1)
Lymphs: 35 %
MCH: 25.9 pg — ABNORMAL LOW (ref 26.6–33.0)
MCHC: 32.7 g/dL (ref 31.5–35.7)
MCV: 79 fL (ref 79–97)
Monocytes Absolute: 0.8 10*3/uL (ref 0.1–0.9)
Monocytes: 9 %
Neutrophils Absolute: 4.9 10*3/uL (ref 1.4–7.0)
Neutrophils: 54 %
Platelets: 397 10*3/uL (ref 150–450)
RBC: 5.68 x10E6/uL (ref 4.14–5.80)
RDW: 15.3 % (ref 11.6–15.4)
WBC: 9.1 10*3/uL (ref 3.4–10.8)

## 2020-08-13 LAB — TSH: TSH: 1.55 u[IU]/mL (ref 0.450–4.500)

## 2020-09-30 ENCOUNTER — Other Ambulatory Visit: Payer: Self-pay | Admitting: Psychiatry

## 2020-10-25 ENCOUNTER — Telehealth: Payer: Self-pay | Admitting: Pharmacy Technician

## 2020-10-25 NOTE — Telephone Encounter (Signed)
Patient failed to provide 2022 proof of income.  No additional medication assistance will be provided by [facility] without the required proof of income documentation.  Patient notified by letter.  [name] Care Manager [facility]   [address] [place], [place]  [address]  October 25, 2020    Ron Beske [address] [place], [place]  [address]  Dear Cristal Deer:  This is to inform you that you are no longer eligible to receive medication assistance at [facility].  The reason(s) are:    _____Your total gross monthly household income exceeds 250% of the Federal Poverty Level.   _____Tangible assets (savings, checking, stocks/bonds, pension, retirement, etc.) exceeds our limit  _____You are eligible to receive benefits from [facility], [facility] or HIV Medication              Assistance Program _____You are eligible to receive benefits from a Medicare Part "D" plan _____You have prescription insurance  _____You are not an [facility] resident __X__Failure to provide all requested proof of income information for 2022.    Medication assistance will resume once all requested financial information has been returned to our clinic.  If you have questions, please contact our clinic at [phone].    Thank you,  [facility]

## 2020-11-10 ENCOUNTER — Other Ambulatory Visit: Payer: Self-pay

## 2020-11-10 ENCOUNTER — Other Ambulatory Visit: Payer: Self-pay | Admitting: Family Medicine

## 2020-11-10 MED FILL — Fluticasone Propionate Nasal Susp 50 MCG/ACT: NASAL | 60 days supply | Qty: 16 | Fill #0 | Status: AC

## 2020-11-11 ENCOUNTER — Other Ambulatory Visit: Payer: Self-pay

## 2020-11-11 ENCOUNTER — Ambulatory Visit: Payer: Medicaid Other | Admitting: Family Medicine

## 2020-11-11 VITALS — BP 129/89 | Temp 97.6°F | Ht 69.0 in | Wt 189.0 lb

## 2020-11-11 DIAGNOSIS — K219 Gastro-esophageal reflux disease without esophagitis: Secondary | ICD-10-CM

## 2020-11-11 DIAGNOSIS — I10 Essential (primary) hypertension: Secondary | ICD-10-CM

## 2020-11-11 DIAGNOSIS — F319 Bipolar disorder, unspecified: Secondary | ICD-10-CM

## 2020-11-11 MED ORDER — QUETIAPINE FUMARATE 100 MG PO TABS
ORAL_TABLET | ORAL | 0 refills | Status: DC
  Filled 2020-11-11: qty 30, 30d supply, fill #0

## 2020-11-11 MED ORDER — TRAZODONE HCL 100 MG PO TABS
ORAL_TABLET | ORAL | 0 refills | Status: DC
  Filled 2020-11-11: qty 45, 30d supply, fill #0

## 2020-11-11 NOTE — Patient Instructions (Signed)
Okay to stop sucralfate.

## 2020-11-11 NOTE — Progress Notes (Signed)
Established Patient Office Visit  Subjective:  Patient ID: Jose May, male    DOB: Jul 19, 1966  Age: 55 y.o. MRN: [id number]  CC:  Chief Complaint  Patient presents with  . Follow-up    HPI Jose May presents for  Follow-up of chronic reflux.  He is no longer taking the Carafate but is doing well just with the daily omeprazole.  No breakthrough. He has intentionally been working on diet and weight loss. He is also started a new job as an excited about this and he is also get his car fixed recently.  Past Medical History:  Diagnosis Date  . Anxiety   . Bipolar 1 disorder ([facility])   . Hypertension   . PTSD (post-traumatic stress disorder)     No past surgical history on file.  Family History  Problem Relation Age of Onset  . Diabetes Mother   . Hypertension Mother   . Hypertension Father     Social History   Socioeconomic History  . Marital status: Married    Spouse name: Jose May   . Number of children: 2  . Years of education: some college   . Highest education level: Not on file  Occupational History  . Not on file  Tobacco Use  . Smoking status: Never Smoker  . Smokeless tobacco: Never Used  Vaping Use  . Vaping Use: Never used  Substance and Sexual Activity  . Alcohol use: No  . Drug use: No  . Sexual activity: Yes    Partners: Female    Birth control/protection: None  Other Topics Concern  . Not on file  Social History Narrative   PT works at [employer].    Social Determinants of Health   Financial Resource Strain: Not on file  Food Insecurity: Not on file  Transportation Needs: Not on file  Physical Activity: Not on file  Stress: Not on file  Social Connections: Not on file  Intimate Partner Violence: Not on file    Outpatient Medications Prior to Visit  Medication Sig Dispense Refill  . dicyclomine (BENTYL) 20 MG tablet TAKE 1 TABLET BY MOUTH EVERY SIX HOURS AS NEEDED FOR ABDOMINAL CRAMPING 20  tablet 0  . fluticasone (FLONASE) 50 MCG/ACT nasal spray PLACE 2 SPRAYS IN BOTH NOSTRILS AT BEDTIME 16 g 1  . omeprazole (PRILOSEC) 20 MG capsule Take 1 capsule (20 mg total) by mouth daily. 30 capsule 0  . omeprazole (PRILOSEC) 20 MG capsule TAKE ONE CAPSULE BY MOUTH EVERY DAY 90 capsule 3  . QUEtiapine (SEROQUEL) 100 MG tablet Take 1 tablet (100 mg total) by mouth at bedtime. 3 tablet 0  . QUEtiapine (SEROQUEL) 100 MG tablet TAKE ONE TABLET BY MOUTH AT BEDTIME 30 tablet 0  . QUEtiapine (SEROQUEL) 100 MG tablet TAKE 1 TABLET BY MOUTH AT BEDTIME 30 tablet 0  . QUEtiapine (SEROQUEL) 100 MG tablet Take 1 tablet by mouth at bedtime 30 tablet 0  . sucralfate (CARAFATE) 1 g tablet TAKE 1 TO 2 TABLETS BY MOUTH NIGHTLY 42 tablet 0  . sucralfate (CARAFATE) 1 GM/10ML suspension Take 10-20 ml nightly 420 mL 0  . traZODone (DESYREL) 100 MG tablet Take 100 mg by mouth at bedtime.    . traZODone (DESYREL) 100 MG tablet Take 1/2 tablet by mouth every morning and take 1 tablet by mouth at bedtime 45 tablet 0   No facility-administered medications prior to visit.    Allergies  Allergen Reactions  . Coconut Flavor   .  Eggs Or Egg-Derived Products   . Peanut-Containing Drug Products   . Tomato Itching  . Wheat Bran     ROS Review of Systems    Objective:    Physical Exam  There were no vitals taken for this visit. Wt Readings from Last 3 Encounters:  08/12/20 193 lb 4.8 oz (87.7 kg)  07/16/19 190 lb (86.2 kg)  11/25/18 178 lb (80.7 kg)   Heart regular rate and rhythm Lungs are clear Abdomen soft and nontender Patient is appropriate and alert and oriented.  Health Maintenance Due  Topic Date Due  . Hepatitis C Screening  Never done  . HIV Screening  Never done  . TETANUS/TDAP  Never done  . COLONOSCOPY (Pts 45-38yrs Insurance coverage will need to be confirmed)  Never done    There are no preventive care reminders to display for this patient.  Lab Results  Component Value Date    TSH 1.550 08/12/2020   Lab Results  Component Value Date   WBC 9.1 08/12/2020   HGB 14.7 08/12/2020   HCT 44.9 08/12/2020   MCV 79 08/12/2020   PLT 397 08/12/2020   Lab Results  Component Value Date   NA 141 08/12/2020   K 4.2 08/12/2020   CO2 23 08/12/2020   GLUCOSE 87 08/12/2020   BUN 12 08/12/2020   CREATININE 1.08 08/12/2020   BILITOT 0.4 08/12/2020   ALKPHOS 111 08/12/2020   AST 11 08/12/2020   ALT 13 08/12/2020   PROT 8.0 08/12/2020   ALBUMIN 4.4 08/12/2020   CALCIUM 9.4 08/12/2020   ANIONGAP 3 (L) 03/10/2018   Lab Results  Component Value Date   CHOL 117 10/25/2017   Lab Results  Component Value Date   HDL 49 10/25/2017   Lab Results  Component Value Date   LDLCALC 44 10/25/2017   Lab Results  Component Value Date   TRIG 121 10/25/2017   Lab Results  Component Value Date   CHOLHDL 2.4 10/25/2017   No results found for: HGBA1C    Assessment & Plan:   Problem List Items Addressed This Visit   None   1. Gastroesophageal reflux disease without esophagitis Continue Prilosec for the time being as he starting a new job and having his car worked well.  Overall feels well. Carafate has been discontinued but continued him omeprazole for the time being until the next visit in 2 to 3 months.  2. Primary hypertension Normotensive with weight down.  3.  Bipolar 1 disorder Followed by mental health.   No orders of the defined types were placed in this encounter.   Follow-up: No follow-ups on file.  Follow-up 2 to 3 months.  Megan Mans, MD

## 2020-11-12 ENCOUNTER — Other Ambulatory Visit: Payer: Self-pay

## 2020-11-18 ENCOUNTER — Other Ambulatory Visit: Payer: Self-pay

## 2020-11-18 MED ORDER — QUETIAPINE FUMARATE 100 MG PO TABS: ORAL_TABLET | Freq: Every day | ORAL | 0 refills | Status: DC

## 2020-11-22 ENCOUNTER — Other Ambulatory Visit: Payer: Self-pay

## 2020-12-02 ENCOUNTER — Other Ambulatory Visit: Payer: Self-pay

## 2020-12-09 IMAGING — CT CT RENAL STONE PROTOCOL
2 of 4 series · 16 of 46 positions shown, 18 images · non-contrast
Comparison: 03/11/2018

CLINICAL DATA: Right-sided flank pain with diarrhea.

EXAM:
CT ABDOMEN AND PELVIS WITHOUT CONTRAST
TECHNIQUE: Multidetector CT imaging of the abdomen and pelvis was performed
following the standard protocol without IV contrast.

[Series 2: stone full standard · axial · 0.69mm/px · z∈[-381,+19]mm · 13 of 88 slices shown, 15 images]
[im 4/88  soft-tissue]
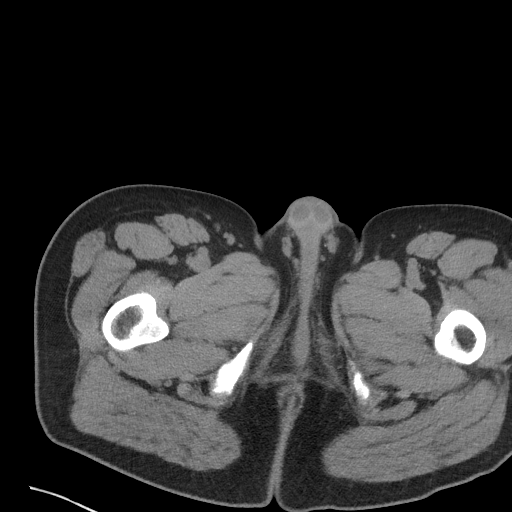
[im 4/88  bone]
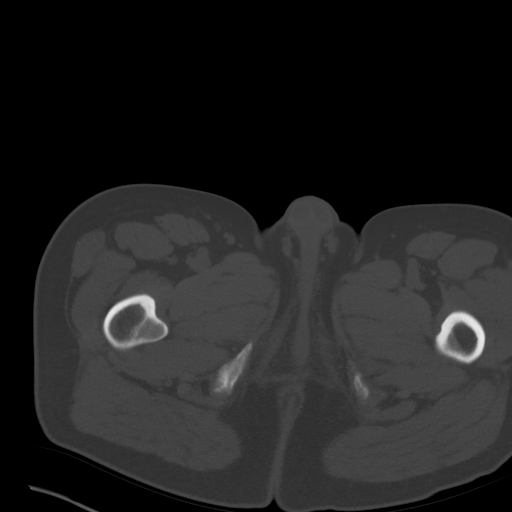
[im 11/88  soft-tissue]
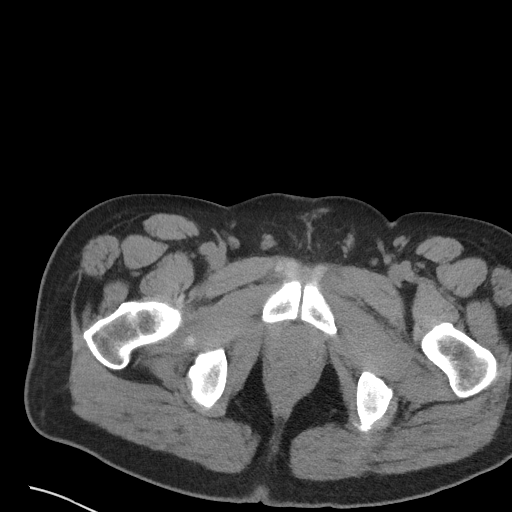
[im 17/88  soft-tissue]
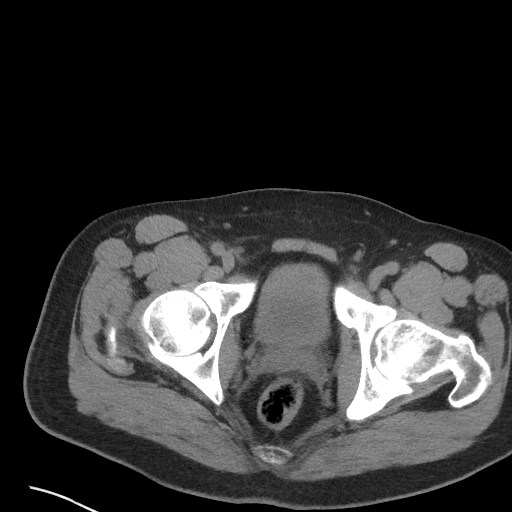
[im 24/88  soft-tissue]
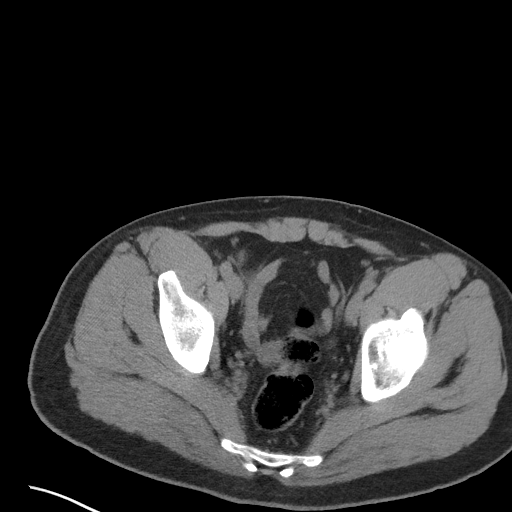
[im 31/88  soft-tissue]
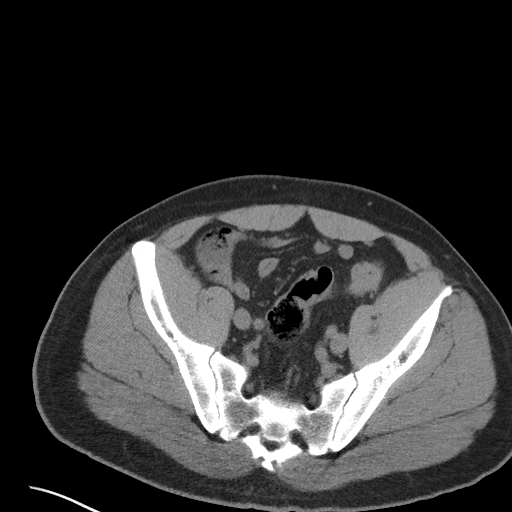
[im 37/88  soft-tissue]
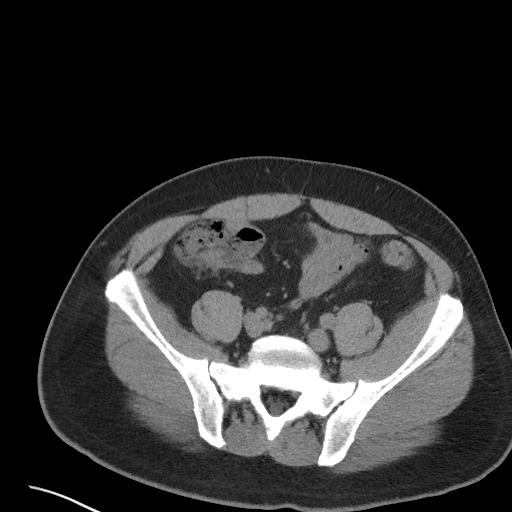
[im 44/88  soft-tissue]
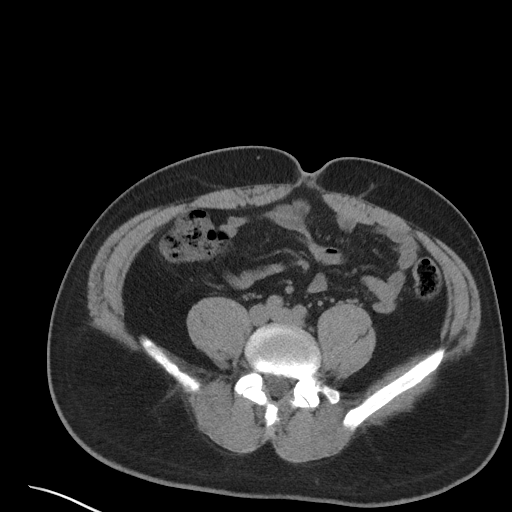
[im 51/88  soft-tissue]
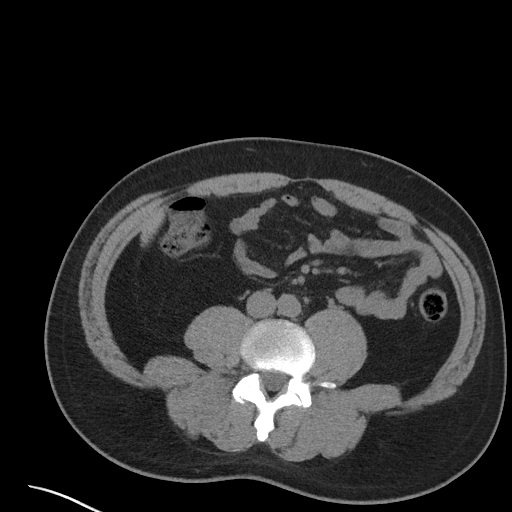
[im 57/88  soft-tissue]
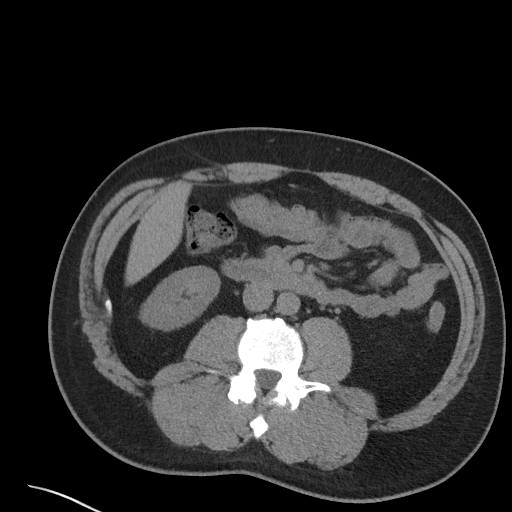
[im 57/88  bone]
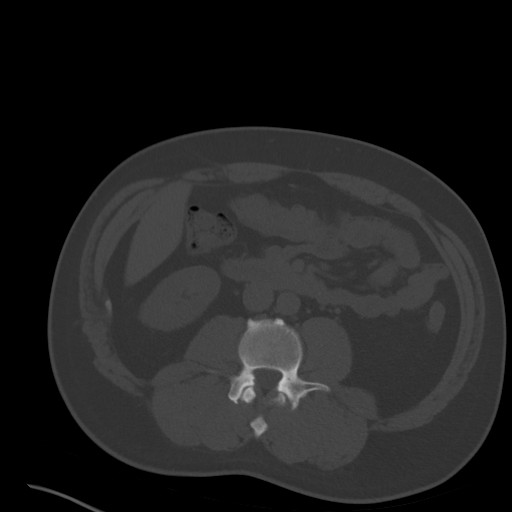
[im 64/88  soft-tissue]
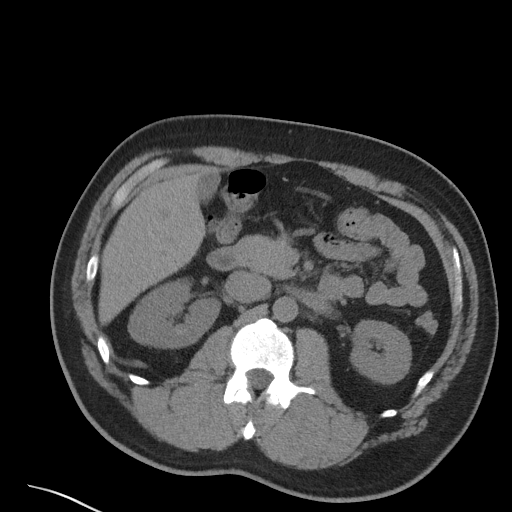
[im 71/88  soft-tissue]
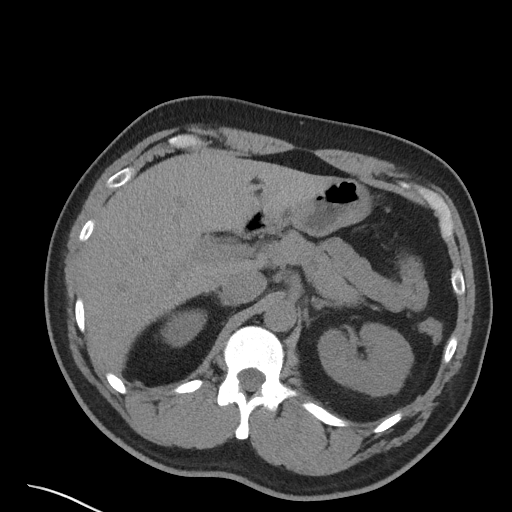
[im 77/88  soft-tissue]
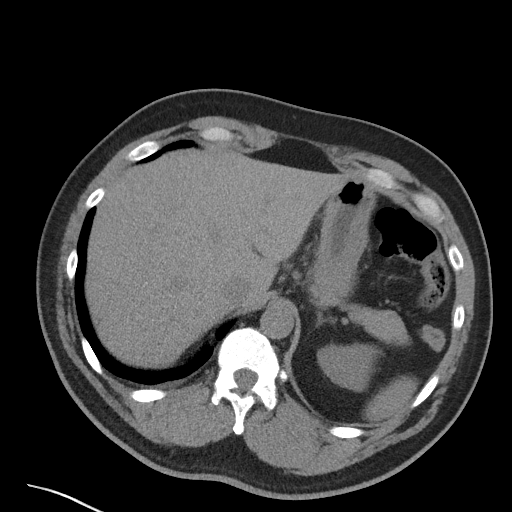
[im 84/88  soft-tissue]
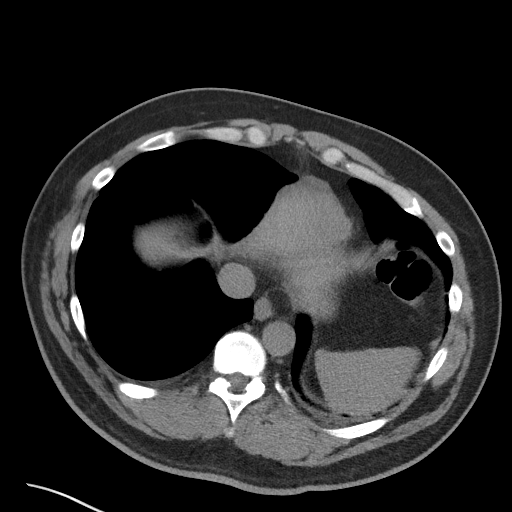

[Series 5: coronal · coronal · 0.66mm/px · 3 of 137 slices shown]
[im 46/137  soft-tissue]
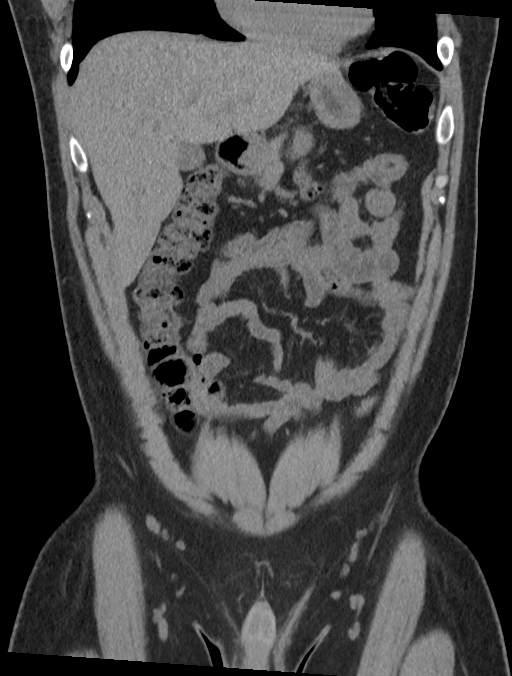
[im 61/137  soft-tissue]
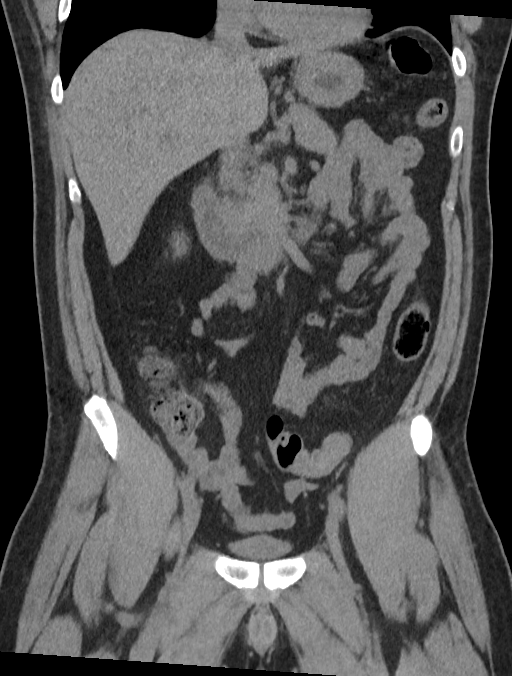
[im 76/137  soft-tissue]
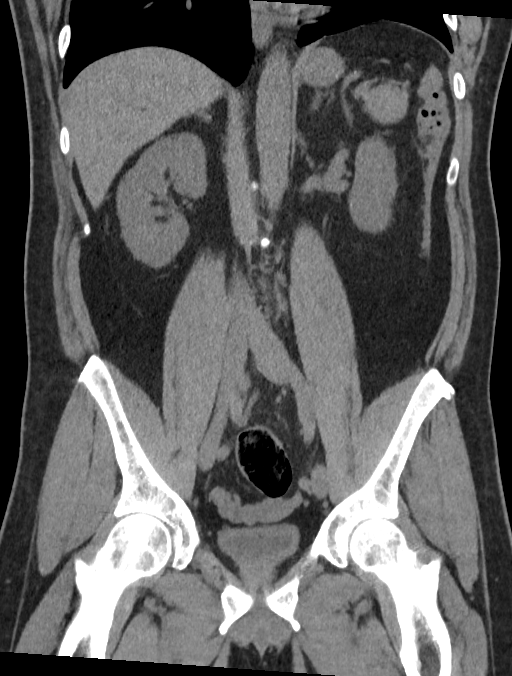

[16 of 46 positions shown; findings below may reference images not displayed]

FINDINGS: LOWER CHEST: There is no basilar pleural or apical pericardial
effusion.

HEPATOBILIARY: The hepatic contours and density are normal. There is
no intra- or extrahepatic biliary dilatation. The gallbladder is
normal.

PANCREAS: The pancreatic parenchymal contours are normal and there
is no ductal dilatation. There is no peripancreatic fluid
collection.

SPLEEN: Normal.

ADRENALS/URINARY TRACT:

--Adrenal glands: Normal.

--Right kidney/ureter: No hydronephrosis, nephroureterolithiasis,
perinephric stranding or solid renal mass.

--Left kidney/ureter: No hydronephrosis, nephroureterolithiasis,
perinephric stranding or solid renal mass.

--Urinary bladder: Normal for degree of distention

STOMACH/BOWEL:

--Stomach/Duodenum: There is no hiatal hernia or other gastric
abnormality. The duodenal course and caliber are normal.

--Small bowel: No dilatation or inflammation.

--Colon: No focal abnormality.

--Appendix: Normal.

VASCULAR/LYMPHATIC: Normal course and caliber of the major abdominal
vessels. No abdominal or pelvic lymphadenopathy.

REPRODUCTIVE: Normal prostate size with symmetric seminal vesicles.

MUSCULOSKELETAL. No bony spinal canal stenosis or focal osseous
abnormality.

OTHER: None.
IMPRESSION: No obstructive uropathy or nephrolithiasis.

No acute abdominopelvic abnormality.

## 2021-01-26 ENCOUNTER — Ambulatory Visit: Payer: Medicaid Other | Admitting: Gerontology

## 2021-02-01 ENCOUNTER — Ambulatory Visit: Payer: Medicaid Other | Admitting: Gerontology

## 2021-02-24 ENCOUNTER — Other Ambulatory Visit: Payer: Self-pay

## 2021-06-27 ENCOUNTER — Emergency Department
Admission: EM | Admit: 2021-06-27 | Discharge: 2021-06-27 | Discharge status: Against Medical Advice | Payer: Medicaid Other | Source: Home / Self Care

## 2021-07-26 ENCOUNTER — Other Ambulatory Visit: Payer: Self-pay

## 2021-11-16 ENCOUNTER — Telehealth: Payer: Self-pay | Admitting: Gerontology

## 2021-11-16 NOTE — Telephone Encounter (Signed)
-----   Message from Langston Reusing, NP sent at 11/15/2021  3:07 PM EDT ----- ?Pls schedule Mr Ulman an in clinic appointment , thank you and make a telephone note ? ?

## 2021-11-16 NOTE — Telephone Encounter (Signed)
Called and vm. Will try again later ?

## 2021-11-17 ENCOUNTER — Telehealth: Payer: Self-pay | Admitting: Gerontology

## 2021-11-17 NOTE — Telephone Encounter (Deleted)
Left message asking patient to call back to schedule an in ci ?

## 2021-11-17 NOTE — Telephone Encounter (Signed)
Called to schedule an in clinic appointment. No answer and line kept ringing.  ?

## 2021-11-23 ENCOUNTER — Telehealth: Payer: Self-pay

## 2021-11-23 NOTE — Telephone Encounter (Signed)
Phone number no longer valid

## 2021-11-23 NOTE — Telephone Encounter (Signed)
-----   Message from Chioma E Iloabachie, NP sent at 11/15/2021  3:07 PM EDT ----- ?Pls schedule Mr Solarz an in clinic appointment , thank you and make a telephone note ? ?

## 2022-03-13 ENCOUNTER — Other Ambulatory Visit: Payer: Self-pay

## 2022-03-13 ENCOUNTER — Telehealth: Payer: Self-pay | Admitting: Emergency Medicine

## 2022-03-13 ENCOUNTER — Emergency Department
Admission: EM | Admit: 2022-03-13 | Discharge: 2022-03-13 | Disposition: A | Discharge status: Home / Self Care | Payer: Medicaid Other | Attending: Emergency Medicine | Admitting: Emergency Medicine

## 2022-03-13 DIAGNOSIS — R109 Unspecified abdominal pain: Secondary | ICD-10-CM | POA: Insufficient documentation

## 2022-03-13 LAB — COMPREHENSIVE METABOLIC PANEL
ALT: 16 U/L (ref 0–44)
AST: 15 U/L (ref 15–41)
Albumin: 4.1 g/dL (ref 3.5–5.0)
Alkaline Phosphatase: 81 U/L (ref 38–126)
Anion gap: 8 (ref 5–15)
BUN: 13 mg/dL (ref 6–20)
CO2: 25 mmol/L (ref 22–32)
Calcium: 9.4 mg/dL (ref 8.9–10.3)
Chloride: 107 mmol/L (ref 98–111)
Creatinine, Ser: 1.06 mg/dL (ref 0.61–1.24)
GFR, Estimated: >60 mL/min (ref >60)
Glucose, Bld: 115 mg/dL — ABNORMAL HIGH (ref 70–99)
Potassium: 4.1 mmol/L (ref 3.5–5.1)
Sodium: 140 mmol/L (ref 135–145)
Total Bilirubin: 0.7 mg/dL (ref 0.3–1.2)
Total Protein: 8.2 g/dL — ABNORMAL HIGH (ref 6.5–8.1)

## 2022-03-13 LAB — URINALYSIS, ROUTINE W REFLEX MICROSCOPIC
Bilirubin Urine: NEGATIVE
Glucose, UA: NEGATIVE mg/dL
Hgb urine dipstick: NEGATIVE
Ketones, ur: NEGATIVE mg/dL
Leukocytes,Ua: NEGATIVE
Nitrite: NEGATIVE
Protein, ur: NEGATIVE mg/dL
Specific Gravity, Urine: 1.03 (ref 1.005–1.030)
pH: 5 (ref 5.0–8.0)

## 2022-03-13 LAB — CBC
HCT: 44.4 % (ref 39.0–52.0)
Hemoglobin: 13.7 g/dL (ref 13.0–17.0)
MCH: 25.2 pg — ABNORMAL LOW (ref 26.0–34.0)
MCHC: 30.9 g/dL (ref 30.0–36.0)
MCV: 81.6 fL (ref 80.0–100.0)
Platelets: 368 10*3/uL (ref 150–400)
RBC: 5.44 MIL/uL (ref 4.22–5.81)
RDW: 15.5 % (ref 11.5–15.5)
WBC: 7.8 10*3/uL (ref 4.0–10.5)
nRBC: 0 % (ref 0.0–0.2)

## 2022-03-13 LAB — LIPASE, BLOOD: Lipase: 22 U/L (ref 11–51)

## 2022-03-13 MED ORDER — QUETIAPINE FUMARATE 100 MG PO TABS: 100.0000 mg | ORAL_TABLET | Freq: Every day | ORAL | 0 refills | Status: DC

## 2022-03-13 MED ORDER — TRAZODONE HCL 100 MG PO TABS: ORAL_TABLET | ORAL | 0 refills | Status: DC

## 2022-03-13 NOTE — ED Provider Notes (Signed)
[facility] Provider Note   Event Date/Time   First MD Initiated Contact with Patient 03/13/22 1134     (approximate) History  Abdominal Pain  HPI Secundino Ellithorpe is a 56 y.o. male with a stated past medical history of kidney stones who presents for right flank pain that began last night into today and has since mostly resolved.  Patient states that it was approximately 10/10 in severity at its worst and felt similar to previous kidney stones that he has had in the past.  Patient currently denies any significant flank pain but states that it is still there somewhat approximately a 2/10.  Patient has not noticed any gross hematuria or difficulty with urination.  Patient denies any recent travel, sick contacts, or food out of the ordinary. ROS: Patient currently denies any vision changes, tinnitus, difficulty speaking, facial droop, sore throat, chest pain, shortness of breath, abdominal pain, nausea/vomiting/diarrhea, dysuria, or weakness/numbness/paresthesias in any extremity   Physical Exam  Triage Vital Signs: ED Triage Vitals  Enc Vitals Group     BP 03/13/22 1055 (!) 149/116     Pulse Rate 03/13/22 1055 64     Resp 03/13/22 1055 17     Temp 03/13/22 1055 98.4 F (36.9 C)     Temp Source 03/13/22 1055 Oral     SpO2 03/13/22 1055 99 %     Weight --      Height --      Head Circumference --      Peak Flow --      Pain Score 03/13/22 1113 6     Pain Loc --      Pain Edu? --      Excl. in GC? --    Most recent vital signs: Vitals:   03/13/22 1055 03/13/22 1155  BP: (!) 149/116 (!) 144/78  Pulse: 64 66  Resp: 17 18  Temp: 98.4 F (36.9 C) 98.7 F (37.1 C)  SpO2: 99% 98%   General: Awake, oriented x4. CV:  Good peripheral perfusion.  Resp:  Normal effort.  Abd:  No distention.  Other:  Middle-aged overweight African-American male laying in bed in no acute distress ED Results / Procedures / Treatments  Labs (all labs ordered are listed,  but only abnormal results are displayed) Labs Reviewed  COMPREHENSIVE METABOLIC PANEL - Abnormal; Notable for the following components:      Result Value   Glucose, Bld 115 (*)    Total Protein 8.2 (*)    All other components within normal limits  CBC - Abnormal; Notable for the following components:   MCH 25.2 (*)    All other components within normal limits  URINALYSIS, ROUTINE W REFLEX MICROSCOPIC - Abnormal; Notable for the following components:   Color, Urine YELLOW (*)    APPearance CLEAR (*)    All other components within normal limits  LIPASE, BLOOD  PROCEDURES: Critical Care performed: No Procedures MEDICATIONS ORDERED IN ED: Medications - No data to display IMPRESSION / MDM / ASSESSMENT AND PLAN / ED COURSE  I reviewed the triage vital signs and the nursing notes.                             The patient is on the cardiac monitor to evaluate for evidence of arrhythmia and/or significant heart rate changes. Patient's presentation is most consistent with acute presentation with potential threat to life or bodily function. Patient presents  for abdominal pain.  Differential diagnosis includes appendicitis, abdominal aortic aneurysm, surgical biliary disease, pancreatitis, SBO, mesenteric ischemia, serious intra-abdominal bacterial illness, genital torsion. Doubt atypical ACS. Based on history, physical exam, radiologic/laboratory evaluation, there is no red flag results or symptomatology requiring emergent intervention or need for admission at this time UA shows no evidence of acute infection or hematuria Pt tolerating PO. Disposition: Patient will be discharged with strict return precautions and follow up with primary MD within 12-24 hours for further evaluation. Patient understands that this still may have an early presentation of an emergent medical condition such as appendicitis that will require a recheck.   FINAL CLINICAL IMPRESSION(S) / ED DIAGNOSES   Final diagnoses:   Right flank pain   Rx / DC Orders   ED Discharge Orders          Ordered    QUEtiapine (SEROQUEL) 100 MG tablet  Daily at bedtime,   Status:  Discontinued        03/13/22 1155    traZODone (DESYREL) 100 MG tablet        03/13/22 1155    QUEtiapine (SEROQUEL) 100 MG tablet  Daily at bedtime,   Status:  Discontinued        03/13/22 1156           Note:  This document was prepared using Dragon voice recognition software and may include unintentional dictation errors.   Merwyn Katos, MD 03/13/22 [phone]

## 2022-03-13 NOTE — Telephone Encounter (Signed)
Seroquel not sent

## 2022-03-13 NOTE — ED Triage Notes (Signed)
Pt reports abdominal cramping and pain that stated at 2am. Pt denies any urinary symptoms. Pt has hx kidney stones and states feel similar.

## 2022-04-08 ENCOUNTER — Emergency Department
Admission: EM | Admit: 2022-04-08 | Discharge: 2022-04-08 | Disposition: A | Discharge status: Home / Self Care | Payer: Medicaid Other | Attending: Emergency Medicine | Admitting: Emergency Medicine

## 2022-04-08 ENCOUNTER — Other Ambulatory Visit: Payer: Self-pay

## 2022-04-08 DIAGNOSIS — G4700 Insomnia, unspecified: Secondary | ICD-10-CM | POA: Insufficient documentation

## 2022-04-08 DIAGNOSIS — Z760 Encounter for issue of repeat prescription: Secondary | ICD-10-CM | POA: Insufficient documentation

## 2022-04-08 DIAGNOSIS — I10 Essential (primary) hypertension: Secondary | ICD-10-CM | POA: Insufficient documentation

## 2022-04-08 MED ORDER — TRAZODONE HCL 100 MG PO TABS
ORAL_TABLET | ORAL | 0 refills | Status: AC
Start: 2022-04-08 — End: ?

## 2022-04-08 MED ORDER — QUETIAPINE FUMARATE 100 MG PO TABS
100.0000 mg | ORAL_TABLET | Freq: Every day | ORAL | 0 refills | Status: AC
Start: 2022-04-08 — End: 2022-05-08

## 2022-04-08 NOTE — ED Triage Notes (Signed)
Pt called from [facility] to treatment room, no response 

## 2022-04-08 NOTE — ED Triage Notes (Signed)
Pt presents to ED with c/o of not being able to sleep due to being out of his medications. Pt states he is out of his trazodone and seroquil and states [facility] cant see him until 9/7 and they wont give him any more refills until seen. Pt denies any complaints at this time.

## 2022-04-08 NOTE — ED Provider Notes (Signed)
   [facility] Provider Note    Event Date/Time   First MD Initiated Contact with Patient 04/08/22 1514     (approximate)  History   Chief Complaint: Medication Refill  HPI  Jose May is a 56 y.o. male with a past medical history anxiety, bipolar, PTSD, hypertension presents to the emergency department not being able to sleep as he is out of prescriptions for his trazodone and Seroquel.  According to the patient and wife patient has an appointment with [facility] on 04/13/2022 but ran out of his trazodone and Seroquel 2 days ago.  Patient states he has not really been sleeping for the past 2 days as he has been on these medications for many years.  He is just hoping for a 1 week prescription of these medications to bridge him until he sees psychiatry on 04/13/2022.  Patient has no medical complaints today.  Physical Exam   Triage Vital Signs: ED Triage Vitals [04/08/22 1207]  Enc Vitals Group     BP (!) 154/105     Pulse Rate 97     Resp 17     Temp 98.6 F (37 C)     Temp Source Oral     SpO2 98 %     Weight      Height      Head Circumference      Peak Flow      Pain Score 0     Pain Loc      Pain Edu?      Excl. in GC?     Most recent vital signs: Vitals:   04/08/22 1207  BP: (!) 154/105  Pulse: 97  Resp: 17  Temp: 98.6 F (37 C)  SpO2: 98%    General: Awake, no distress.  CV:  Good peripheral perfusion.  Regular rate and rhythm  Resp:  Normal effort.  Equal breath sounds bilaterally.  Abd:  No distention.  Soft, nontender.  No rebound or guarding.    ED Results / Procedures / Treatments   MEDICATIONS ORDERED IN ED: Medications - No data to display   IMPRESSION / MDM / ASSESSMENT AND PLAN / ED COURSE  I reviewed the triage vital signs and the nursing notes.  Patient's presentation is most consistent with acute, uncomplicated illness.  Patient presents to the emergency department with insomnia after running out of his  trazodone and Seroquel which he takes at night, 2 days ago.  Overall the patient appears well with no medical complaints.  Wife is here with the patient as well.  We will prescribe 1 week prescriptions for both of these medications and the patient will follow-up with [facility].  Patient and wife agreeable to plan.  FINAL CLINICAL IMPRESSION(S) / ED DIAGNOSES   Medication refill  Rx / DC Orders   Seroquel Trazodone  Note:  This document was prepared using Dragon voice recognition software and may include unintentional dictation errors.   Minna Antis, MD 04/08/22 [phone]

## 2022-11-06 NOTE — Progress Notes (Deleted)
New patient visit   Patient: Jose May   DOB: 04-18-1966   57 y.o. Male  MRN: [id number] Visit Date: 11/08/2022  Today's healthcare provider: Eulis Foster, MD   No chief complaint on file.  Subjective    [name] is a 57 y.o. male who presents today as a new patient to establish care.  HPI  ***  Past Medical History:  Diagnosis Date   Anxiety    Bipolar 1 disorder ([place])    Hypertension    PTSD (post-traumatic stress disorder)    No past surgical history on file. Family Status  Relation Name Status   Mother  Alive   Father  Alive   Family History  Problem Relation Age of Onset   Diabetes Mother    Hypertension Mother    Hypertension Father    Social History   Socioeconomic History   Marital status: Married    Spouse name: Dameron Hughbanks    Number of children: 2   Years of education: some college    Highest education level: Not on file  Occupational History   Not on file  Tobacco Use   Smoking status: Never   Smokeless tobacco: Never  Vaping Use   Vaping Use: Never used  Substance and Sexual Activity   Alcohol use: No   Drug use: No   Sexual activity: Yes    Partners: Female    Birth control/protection: None  Other Topics Concern   Not on file  Social History Narrative   PT works at [employer].    Social Determinants of Health   Financial Resource Strain: Medium Risk (05/02/2018)   Overall Financial Resource Strain (CARDIA)    Difficulty of Paying Living Expenses: Somewhat hard  Food Insecurity: Food Insecurity Present (05/02/2018)   Hunger Vital Sign    Worried About Running Out of Food in the Last Year: Sometimes true    Ran Out of Food in the Last Year: Sometimes true  Transportation Needs: Unmet Transportation Needs (05/02/2018)   PRAPARE - Hydrologist (Medical): No    Lack of Transportation (Non-Medical): Yes  Physical Activity: Inactive (08/14/2017)    Exercise Vital Sign    Days of Exercise per Week: 0 days    Minutes of Exercise per Session: 0 min  Stress: Stress Concern Present (08/14/2017)   [place]    Feeling of Stress : Very much  Social Connections: Moderately Integrated (05/02/2018)   Social Connection and Isolation Panel [NHANES]    Frequency of Communication with Friends and Family: More than three times a week    Frequency of Social Gatherings with Friends and Family: Twice a week    Attends Religious Services: More than 4 times per year    Active Member of [employer] or Organizations: No    Attends Archivist Meetings: Never    Marital Status: Married   Outpatient Medications Prior to Visit  Medication Sig   dicyclomine (BENTYL) 20 MG tablet TAKE 1 TABLET BY MOUTH EVERY SIX HOURS AS NEEDED FOR ABDOMINAL CRAMPING (Patient not taking: Reported on 11/11/2020)   fluticasone (FLONASE) 50 MCG/ACT nasal spray PLACE 2 SPRAYS IN BOTH NOSTRILS AT BEDTIME   omeprazole (PRILOSEC) 20 MG capsule Take 1 capsule (20 mg total) by mouth daily.   omeprazole (PRILOSEC) 20 MG capsule TAKE ONE CAPSULE BY MOUTH EVERY DAY   QUEtiapine (SEROQUEL) 100 MG tablet Take 1 tablet (  100 mg total) by mouth at bedtime.   sucralfate (CARAFATE) 1 g tablet TAKE 1 TO 2 TABLETS BY MOUTH NIGHTLY (Patient not taking: Reported on 11/11/2020)   sucralfate (CARAFATE) 1 GM/10ML suspension Take 10-20 ml nightly (Patient not taking: Reported on 11/11/2020)   traZODone (DESYREL) 100 MG tablet Take 1/2 tablet by mouth every morning and take 1 tablet by mouth at bedtime   No facility-administered medications prior to visit.   Allergies  Allergen Reactions   Coconut Flavor    Egg-Derived Products    Fish Allergy    Peanut-Containing Drug Products    Shellfish Allergy    Shrimp (Diagnostic)    Tomato Itching   Wheat      There is no immunization history on file for this patient.  Health Maintenance   Topic Date Due   COVID-19 Vaccine (1) Never done   HIV Screening  Never done   Hepatitis C Screening  Never done   DTaP/Tdap/Td (1 - Tdap) Never done   Zoster Vaccines- Shingrix (1 of 2) Never done   COLONOSCOPY (Pts 45-76yrs Insurance coverage will need to be confirmed)  Never done   INFLUENZA VACCINE  03/08/2023   HPV VACCINES  Aged Out    Patient Care Team: Pcp, No as PCP - General  Review of Systems  {Labs  Heme  Chem  Endocrine  Serology  Results Review (optional):23779}   Objective    There were no vitals taken for this visit. {Show previous vital signs (optional):23777}  Physical Exam ***  Depression Screen     No data to display         No results found for any visits on 11/08/22.  Assessment & Plan      Problem List Items Addressed This Visit   None    No follow-ups on file.      The entirety of the information documented in the History of Present Illness, Review of Systems and Physical Exam were personally obtained by me. Portions of this information were initially documented by *** . I, Eulis Foster, MD have reviewed the documentation above for thoroughness and accuracy.      Eulis Foster, MD  [facility] [phone] (phone) [phone] (fax)  [place]

## 2022-11-08 ENCOUNTER — Ambulatory Visit: Payer: BLUE CROSS/BLUE SHIELD | Admitting: Family Medicine

## 2022-12-29 ENCOUNTER — Ambulatory Visit: Payer: BLUE CROSS/BLUE SHIELD | Admitting: Physician Assistant

## 2022-12-29 NOTE — Progress Notes (Deleted)
I,Kattia Selley,acting as a Neurosurgeon for [employer], PA-C.,have documented all relevant documentation on the behalf of Alfredia Ferguson, PA-C,as directed by  Alfredia Ferguson, PA-C while in the presence of Alfredia Ferguson, PA-C.   New patient visit   Patient: Jose May   DOB: 02/05/1966   57 y.o. Male  MRN: [id number] Visit Date: 12/29/2022  Today's healthcare provider: Alfredia Ferguson, PA-C   No chief complaint on file.  Subjective    Jose May is a 57 y.o. male who presents today as a new patient to establish care.  HPI  ***  Past Medical History:  Diagnosis Date   Anxiety    Bipolar 1 disorder (HCC)    Hypertension    PTSD (post-traumatic stress disorder)    No past surgical history on file. Family Status  Relation Name Status   Mother  Alive   Father  Alive   Family History  Problem Relation Age of Onset   Diabetes Mother    Hypertension Mother    Hypertension Father    Social History   Socioeconomic History   Marital status: Married    Spouse name: Yoniel Hinzman    Number of children: 2   Years of education: some college    Highest education level: Not on file  Occupational History   Not on file  Tobacco Use   Smoking status: Never   Smokeless tobacco: Never  Vaping Use   Vaping Use: Never used  Substance and Sexual Activity   Alcohol use: No   Drug use: No   Sexual activity: Yes    Partners: Female    Birth control/protection: None  Other Topics Concern   Not on file  Social History Narrative   PT works at [employer].    Social Determinants of Health   Financial Resource Strain: Medium Risk (05/02/2018)   Overall Financial Resource Strain (CARDIA)    Difficulty of Paying Living Expenses: Somewhat hard  Food Insecurity: Food Insecurity Present (05/02/2018)   Hunger [place] Sign    Worried About Running Out of Food in the Last Year: Sometimes true    Ran Out of Food in the Last Year: Sometimes true   Transportation Needs: Unmet Transportation Needs (05/02/2018)   PRAPARE - Administrator, Civil Service (Medical): No    Lack of Transportation (Non-Medical): Yes  Physical Activity: Inactive (08/14/2017)   Exercise Debraann Livingstone Sign    Days of Exercise per Week: 0 days    Minutes of Exercise per Session: 0 min  Stress: Stress Concern Present (08/14/2017)   [employer] of Occupational Health - Occupational Stress Questionnaire    Feeling of Stress : Very much  Social Connections: Moderately Integrated (05/02/2018)   Social Connection and Isolation Panel [NHANES]    Frequency of Communication with Friends and Family: More than three times a week    Frequency of Social Gatherings with Friends and Family: Twice a week    Attends Religious Services: More than 4 times per year    Active Member of [employer] or Organizations: No    Attends Banker Meetings: Never    Marital Status: Married   Outpatient Medications Prior to Visit  Medication Sig   dicyclomine (BENTYL) 20 MG tablet TAKE 1 TABLET BY MOUTH EVERY SIX HOURS AS NEEDED FOR ABDOMINAL CRAMPING (Patient not taking: Reported on 11/11/2020)   fluticasone (FLONASE) 50 MCG/ACT nasal spray PLACE 2 SPRAYS IN BOTH NOSTRILS AT BEDTIME   omeprazole (PRILOSEC)  20 MG capsule Take 1 capsule (20 mg total) by mouth daily.   omeprazole (PRILOSEC) 20 MG capsule TAKE ONE CAPSULE BY MOUTH EVERY DAY   QUEtiapine (SEROQUEL) 100 MG tablet Take 1 tablet (100 mg total) by mouth at bedtime.   sucralfate (CARAFATE) 1 g tablet TAKE 1 TO 2 TABLETS BY MOUTH NIGHTLY (Patient not taking: Reported on 11/11/2020)   sucralfate (CARAFATE) 1 GM/10ML suspension Take 10-20 ml nightly (Patient not taking: Reported on 11/11/2020)   traZODone (DESYREL) 100 MG tablet Take 1/2 tablet by mouth every morning and take 1 tablet by mouth at bedtime   No facility-administered medications prior to visit.   Allergies  Allergen Reactions   Coconut Flavor    Egg-Derived  Products    Fish Allergy    Peanut-Containing Drug Products    Shellfish Allergy    Shrimp (Diagnostic)    Tomato Itching   Wheat      There is no immunization history on file for this patient.  Health Maintenance  Topic Date Due   COVID-19 Vaccine (1) Never done   HIV Screening  Never done   Hepatitis C Screening  Never done   DTaP/Tdap/Td (1 - Tdap) Never done   Zoster Vaccines- Shingrix (1 of 2) Never done   Colonoscopy  Never done   INFLUENZA VACCINE  03/08/2023   HPV VACCINES  Aged Out    Patient Care Team: Pcp, No as PCP - General  Review of Systems  {Labs  Heme  Chem  Endocrine  Serology  Results Review (optional):23779}   Objective    There were no vitals taken for this visit. {Show previous Alysa Duca signs (optional):23777}  Physical Exam ***  Depression Screen     No data to display         No results found for any visits on 12/29/22.  Assessment & Plan     ***  No follow-ups on file.     {provider attestation***:1}   Alfredia Ferguson, PA-C  [facility] [phone] (phone) [phone] (fax)  [facility]

## 2023-02-13 NOTE — Progress Notes (Deleted)
New patient visit  Patient: Jose May   DOB: 05-23-1966   57 y.o. Male  MRN: [id number] Visit Date: 02/14/2023  Today's healthcare provider: Debera Lat, PA-C   No chief complaint on file.  Subjective    Jose May is a 57 y.o. male who presents today as a new patient to establish care.  HPI  *** Discussed the use of AI scribe software for clinical note transcription with the patient, who gave verbal consent to proceed.  History of Present Illness            Past Medical History:  Diagnosis Date  . Anxiety   . Bipolar 1 disorder (H[facility])   . Hypertension   . PTSD (post-traumatic stress disorder)    No past surgical history on file. Family Status  Relation Name Status  . Mother  Alive  . Father  Alive   Family History  Problem Relation Age of Onset  . Diabetes Mother   . Hypertension Mother   . Hypertension Father    Social History   Socioeconomic History  . Marital status: Married    Spouse name: [name]   . Number of children: 2  . Years of education: some college   . Highest education level: Not on file  Occupational History  . Not on file  Tobacco Use  . Smoking status: Never  . Smokeless tobacco: Never  Vaping Use  . Vaping Use: Never used  Substance and Sexual Activity  . Alcohol use: No  . Drug use: No  . Sexual activity: Yes    Partners: Female    Birth control/protection: None  Other Topics Concern  . Not on file  Social History Narrative   PT works at [employer].    Social Determinants of Health   Financial Resource Strain: Medium Risk (05/02/2018)   Overall Financial Resource Strain (CARDIA)   . Difficulty of Paying Living Expenses: Somewhat hard  Food Insecurity: Food Insecurity Present (05/02/2018)   Hunger Vital Sign   . Worried About Programme researcher, broadcasting/film/video in the Last Year: Sometimes true   . Ran Out of Food in the Last Year: Sometimes true  Transportation Needs: Unmet Transportation  Needs (05/02/2018)   PRAPARE - Transportation   . Lack of Transportation (Medical): No   . Lack of Transportation (Non-Medical): Yes  Physical Activity: Inactive (08/14/2017)   Exercise Vital Sign   . Days of Exercise per Week: 0 days   . Minutes of Exercise per Session: 0 min  Stress: Stress Concern Present (08/14/2017)   [employer] of Occupational Health - Occupational Stress Questionnaire   . Feeling of Stress : Very much  Social Connections: Moderately Integrated (05/02/2018)   Social Connection and Isolation Panel [NHANES]   . Frequency of Communication with Friends and Family: More than three times a week   . Frequency of Social Gatherings with Friends and Family: Twice a week   . Attends Religious Services: More than 4 times per year   . Active Member of Clubs or Organizations: No   . Attends Banker Meetings: Never   . Marital Status: Married   Outpatient Medications Prior to Visit  Medication Sig  . dicyclomine (BENTYL) 20 MG tablet TAKE 1 TABLET BY MOUTH EVERY SIX HOURS AS NEEDED FOR ABDOMINAL CRAMPING (Patient not taking: Reported on 11/11/2020)  . fluticasone (FLONASE) 50 MCG/ACT nasal spray PLACE 2 SPRAYS IN BOTH NOSTRILS AT BEDTIME  . omeprazole (PRILOSEC) 20 MG capsule Take  1 capsule (20 mg total) by mouth daily.  Marland Kitchen omeprazole (PRILOSEC) 20 MG capsule TAKE ONE CAPSULE BY MOUTH EVERY DAY  . QUEtiapine (SEROQUEL) 100 MG tablet Take 1 tablet (100 mg total) by mouth at bedtime.  . sucralfate (CARAFATE) 1 g tablet TAKE 1 TO 2 TABLETS BY MOUTH NIGHTLY (Patient not taking: Reported on 11/11/2020)  . sucralfate (CARAFATE) 1 GM/10ML suspension Take 10-20 ml nightly (Patient not taking: Reported on 11/11/2020)  . traZODone (DESYREL) 100 MG tablet Take 1/2 tablet by mouth every morning and take 1 tablet by mouth at bedtime   No facility-administered medications prior to visit.   Allergies  Allergen Reactions  . Coconut Flavor   . Egg-Derived Products   . Fish Allergy    . Peanut-Containing Drug Products   . Shellfish Allergy   . Shrimp (Diagnostic)   . Tomato Itching  . Wheat      There is no immunization history on file for this patient.  Health Maintenance  Topic Date Due  . COVID-19 Vaccine (1) Never done  . HIV Screening  Never done  . Hepatitis C Screening  Never done  . DTaP/Tdap/Td (1 - Tdap) Never done  . Zoster Vaccines- Shingrix (1 of 2) Never done  . Colonoscopy  Never done  . INFLUENZA VACCINE  03/08/2023  . HPV VACCINES  Aged Out    Patient Care Team: Pcp, No as PCP - General  Review of Systems  All other systems reviewed and are negative. Except see HPI   {Labs  Heme  Chem  Endocrine  Serology  Results Review (optional):23779}   Objective    There were no vitals taken for this visit. {Show previous vital signs (optional):23777}  Physical Exam Vitals reviewed.  Constitutional:      General: He is not in acute distress.    Appearance: Normal appearance. He is not diaphoretic.  HENT:     Head: Normocephalic and atraumatic.  Eyes:     General: No scleral icterus.    Conjunctiva/sclera: Conjunctivae normal.  Cardiovascular:     Rate and Rhythm: Normal rate and regular rhythm.     Pulses: Normal pulses.     Heart sounds: Normal heart sounds. No murmur heard. Pulmonary:     Effort: Pulmonary effort is normal. No respiratory distress.     Breath sounds: Normal breath sounds. No wheezing or rhonchi.  Musculoskeletal:     Cervical back: Neck supple.     Right lower leg: No edema.     Left lower leg: No edema.  Lymphadenopathy:     Cervical: No cervical adenopathy.  Skin:    General: Skin is warm and dry.     Findings: No rash.  Neurological:     Mental Status: He is alert and oriented to person, place, and time. Mental status is at baseline.  Psychiatric:        Mood and Affect: Mood normal.        Behavior: Behavior normal.   Depression Screen     No data to display         No results found for  any visits on 02/14/23.  Assessment & Plan     *** Assessment and Plan              Encounter to establish care Welcomed to our clinic Reviewed past medical hx, social hx, family hx and surgical hx Pt advised to send all vaccination records or screening   No follow-ups on file.  The patient was advised to call back or seek an in-person evaluation if the symptoms worsen or if the condition fails to improve as anticipated.  I discussed the assessment and treatment plan with the patient. The patient was provided an opportunity to ask questions and all were answered. The patient agreed with the plan and demonstrated an understanding of the instructions.  I, Debera Lat, PA-C have reviewed all documentation for this visit. The documentation on  02/14/23  for the exam, diagnosis, procedures, and orders are all accurate and complete.  Debera Lat, [facility], MMS [facility] [phone] (phone) [phone] (fax)  [facility]

## 2023-02-14 ENCOUNTER — Ambulatory Visit: Payer: BLUE CROSS/BLUE SHIELD | Admitting: Physician Assistant

## 2023-02-14 DIAGNOSIS — Z7689 Persons encountering health services in other specified circumstances: Secondary | ICD-10-CM
